# Patient Record
Sex: Male | Born: 1960 | Race: White | Hispanic: No | Marital: Single | Smoking: Former smoker
Health system: Southern US, Community
[De-identification: ages and names within clinical notes are randomized; demographics above are authoritative.]

## PROBLEM LIST (undated history)

## (undated) DIAGNOSIS — K219 Gastro-esophageal reflux disease without esophagitis: Secondary | ICD-10-CM

## (undated) DIAGNOSIS — I10 Essential (primary) hypertension: Secondary | ICD-10-CM

## (undated) DIAGNOSIS — M1990 Unspecified osteoarthritis, unspecified site: Secondary | ICD-10-CM

## (undated) HISTORY — PX: EYE SURGERY: SHX253

## (undated) HISTORY — PX: KNEE ARTHROSCOPY: SUR90

---

## 2017-07-18 DIAGNOSIS — M160 Bilateral primary osteoarthritis of hip: Secondary | ICD-10-CM | POA: Diagnosis not present

## 2017-07-18 DIAGNOSIS — M5116 Intervertebral disc disorders with radiculopathy, lumbar region: Secondary | ICD-10-CM | POA: Diagnosis not present

## 2017-07-18 DIAGNOSIS — M1612 Unilateral primary osteoarthritis, left hip: Secondary | ICD-10-CM | POA: Diagnosis not present

## 2017-07-18 DIAGNOSIS — M5136 Other intervertebral disc degeneration, lumbar region: Secondary | ICD-10-CM | POA: Diagnosis not present

## 2017-07-19 DIAGNOSIS — M1612 Unilateral primary osteoarthritis, left hip: Secondary | ICD-10-CM | POA: Diagnosis not present

## 2017-07-23 ENCOUNTER — Inpatient Hospital Stay: Payer: 59

## 2017-07-23 ENCOUNTER — Encounter
Admission: RE | Disposition: A | Discharge status: Home Health | Payer: Self-pay | Source: Ambulatory Visit | Attending: Orthopedic Surgery

## 2017-07-23 ENCOUNTER — Inpatient Hospital Stay: Payer: 59 | Admitting: Anesthesiology

## 2017-07-23 ENCOUNTER — Encounter: Payer: Self-pay | Admitting: *Deleted

## 2017-07-23 ENCOUNTER — Inpatient Hospital Stay
Admission: RE | Admit: 2017-07-23 | Discharge: 2017-07-25 | DRG: 470 | Disposition: A | Discharge status: Home Health | Payer: 59 | Source: Ambulatory Visit | Attending: Orthopedic Surgery | Admitting: Orthopedic Surgery

## 2017-07-23 DIAGNOSIS — D62 Acute posthemorrhagic anemia: Secondary | ICD-10-CM | POA: Diagnosis not present

## 2017-07-23 DIAGNOSIS — M1612 Unilateral primary osteoarthritis, left hip: Secondary | ICD-10-CM | POA: Diagnosis not present

## 2017-07-23 DIAGNOSIS — Z419 Encounter for procedure for purposes other than remedying health state, unspecified: Secondary | ICD-10-CM

## 2017-07-23 DIAGNOSIS — Z96642 Presence of left artificial hip joint: Secondary | ICD-10-CM | POA: Diagnosis not present

## 2017-07-23 DIAGNOSIS — G8918 Other acute postprocedural pain: Secondary | ICD-10-CM

## 2017-07-23 DIAGNOSIS — Z471 Aftercare following joint replacement surgery: Secondary | ICD-10-CM | POA: Diagnosis not present

## 2017-07-23 HISTORY — PX: TOTAL HIP ARTHROPLASTY: SHX124

## 2017-07-23 HISTORY — DX: Unspecified osteoarthritis, unspecified site: M19.90

## 2017-07-23 LAB — BASIC METABOLIC PANEL
Anion gap: 8 (ref 5–15)
BUN: 20 mg/dL (ref 6–20)
CHLORIDE: 107 mmol/L (ref 101–111)
CO2: 27 mmol/L (ref 22–32)
Calcium: 9.5 mg/dL (ref 8.9–10.3)
Creatinine, Ser: 0.88 mg/dL (ref 0.61–1.24)
GFR calc non Af Amer: >60 mL/min (ref >60)
Glucose, Bld: 101 mg/dL — ABNORMAL HIGH (ref 65–99)
POTASSIUM: 3.1 mmol/L — AB (ref 3.5–5.1)
SODIUM: 142 mmol/L (ref 135–145)

## 2017-07-23 LAB — URINALYSIS, ROUTINE W REFLEX MICROSCOPIC
BILIRUBIN URINE: NEGATIVE
Glucose, UA: NEGATIVE mg/dL
Hgb urine dipstick: NEGATIVE
KETONES UR: NEGATIVE mg/dL
Leukocytes, UA: NEGATIVE
NITRITE: NEGATIVE
PROTEIN: NEGATIVE mg/dL
Specific Gravity, Urine: 1.021 (ref 1.005–1.030)
pH: 5 (ref 5.0–8.0)

## 2017-07-23 LAB — CBC
HCT: 31 % — ABNORMAL LOW (ref 40.0–52.0)
HEMOGLOBIN: 9.8 g/dL — AB (ref 13.0–18.0)
MCH: 22.6 pg — ABNORMAL LOW (ref 26.0–34.0)
MCHC: 31.5 g/dL — ABNORMAL LOW (ref 32.0–36.0)
MCV: 71.7 fL — ABNORMAL LOW (ref 80.0–100.0)
Platelets: 456 10*3/uL — ABNORMAL HIGH (ref 150–440)
RBC: 4.33 MIL/uL — ABNORMAL LOW (ref 4.40–5.90)
RDW: 18 % — ABNORMAL HIGH (ref 11.5–14.5)
WBC: 6 10*3/uL (ref 3.8–10.6)

## 2017-07-23 LAB — TYPE AND SCREEN
ABO/RH(D): A POS
Antibody Screen: NEGATIVE

## 2017-07-23 LAB — APTT: APTT: 26 s (ref 24–36)

## 2017-07-23 LAB — SURGICAL PCR SCREEN
MRSA, PCR: NEGATIVE
STAPHYLOCOCCUS AUREUS: NEGATIVE

## 2017-07-23 LAB — SEDIMENTATION RATE: Sed Rate: 45 mm/hr — ABNORMAL HIGH (ref 0–20)

## 2017-07-23 LAB — ABO/RH: ABO/RH(D): A POS

## 2017-07-23 LAB — PROTIME-INR
INR: 1.01
PROTHROMBIN TIME: 13.3 s (ref 11.4–15.2)

## 2017-07-23 SURGERY — ARTHROPLASTY, HIP, TOTAL, ANTERIOR APPROACH
Anesthesia: General | Site: Hip | Laterality: Left | Wound class: Clean

## 2017-07-23 MED ORDER — ACETAMINOPHEN 325 MG PO TABS: 650.0000 mg | ORAL_TABLET | Freq: Four times a day (QID) | ORAL | Status: DC | PRN

## 2017-07-23 MED ORDER — CEFAZOLIN SODIUM-DEXTROSE 2-4 GM/100ML-% IV SOLN
2.0000 g | Freq: Once | INTRAVENOUS | Status: AC
  Administered 2017-07-23: 2 g via INTRAVENOUS

## 2017-07-23 MED ORDER — PROPOFOL 500 MG/50ML IV EMUL
INTRAVENOUS | Status: DC | PRN
  Administered 2017-07-23: 140 ug/kg/min via INTRAVENOUS

## 2017-07-23 MED ORDER — PHENYLEPHRINE HCL 10 MG/ML IJ SOLN
INTRAMUSCULAR | Status: DC | PRN
  Administered 2017-07-23: 25 ug via INTRAVENOUS

## 2017-07-23 MED ORDER — METHOCARBAMOL 1000 MG/10ML IJ SOLN
500.0000 mg | Freq: Four times a day (QID) | INTRAVENOUS | Status: DC | PRN
  Filled 2017-07-23: qty 5

## 2017-07-23 MED ORDER — BUPIVACAINE HCL (PF) 0.5 % IJ SOLN
INTRAMUSCULAR | Status: DC | PRN
  Administered 2017-07-23: 3 mL

## 2017-07-23 MED ORDER — DIPHENHYDRAMINE HCL 12.5 MG/5ML PO ELIX: 12.5000 mg | ORAL_SOLUTION | ORAL | Status: DC | PRN

## 2017-07-23 MED ORDER — DOCUSATE SODIUM 100 MG PO CAPS
100.0000 mg | ORAL_CAPSULE | Freq: Two times a day (BID) | ORAL | Status: DC
  Administered 2017-07-23 – 2017-07-25 (×4): 100 mg via ORAL
  Filled 2017-07-23 (×4): qty 1

## 2017-07-23 MED ORDER — MIDAZOLAM HCL 2 MG/2ML IJ SOLN
INTRAMUSCULAR | Status: AC
  Filled 2017-07-23: qty 2

## 2017-07-23 MED ORDER — SODIUM CHLORIDE 0.9 % IV SOLN
INTRAVENOUS | Status: DC | PRN
  Administered 2017-07-23: 1000 mg via INTRAVENOUS

## 2017-07-23 MED ORDER — LORAZEPAM 1 MG PO TABS: 1.0000 mg | ORAL_TABLET | ORAL | Status: DC | PRN

## 2017-07-23 MED ORDER — NEOMYCIN-POLYMYXIN B GU 40-200000 IR SOLN
Status: AC
  Filled 2017-07-23: qty 4

## 2017-07-23 MED ORDER — BUPIVACAINE-EPINEPHRINE (PF) 0.25% -1:200000 IJ SOLN
INTRAMUSCULAR | Status: AC
  Filled 2017-07-23: qty 30

## 2017-07-23 MED ORDER — MENTHOL 3 MG MT LOZG
1.0000 | LOZENGE | OROMUCOSAL | Status: DC | PRN
  Filled 2017-07-23: qty 9

## 2017-07-23 MED ORDER — PROPOFOL 10 MG/ML IV BOLUS
INTRAVENOUS | Status: DC | PRN
  Administered 2017-07-23: 100 mg via INTRAVENOUS

## 2017-07-23 MED ORDER — TRANEXAMIC ACID 1000 MG/10ML IV SOLN
INTRAVENOUS | Status: AC
  Filled 2017-07-23: qty 10

## 2017-07-23 MED ORDER — ZOLPIDEM TARTRATE 5 MG PO TABS: 5.0000 mg | ORAL_TABLET | Freq: Every evening | ORAL | Status: DC | PRN

## 2017-07-23 MED ORDER — OXYCODONE HCL 5 MG PO TABS
5.0000 mg | ORAL_TABLET | ORAL | Status: DC | PRN
  Administered 2017-07-23 (×2): 5 mg via ORAL
  Administered 2017-07-24 – 2017-07-25 (×8): 10 mg via ORAL
  Filled 2017-07-23 (×2): qty 2
  Filled 2017-07-23: qty 1
  Filled 2017-07-23 (×3): qty 2
  Filled 2017-07-23: qty 1
  Filled 2017-07-23 (×3): qty 2

## 2017-07-23 MED ORDER — PROPOFOL 500 MG/50ML IV EMUL
INTRAVENOUS | Status: AC
Start: 2017-07-23 — End: 2017-07-23
  Filled 2017-07-23: qty 50

## 2017-07-23 MED ORDER — LACTATED RINGERS IV SOLN
INTRAVENOUS | Status: DC
  Administered 2017-07-23 (×2): via INTRAVENOUS

## 2017-07-23 MED ORDER — BISACODYL 10 MG RE SUPP: 10.0000 mg | Freq: Every day | RECTAL | Status: DC | PRN

## 2017-07-23 MED ORDER — FAMOTIDINE 20 MG PO TABS
20.0000 mg | ORAL_TABLET | Freq: Once | ORAL | Status: AC
  Administered 2017-07-23: 20 mg via ORAL

## 2017-07-23 MED ORDER — ACETAMINOPHEN 650 MG RE SUPP: 650.0000 mg | Freq: Four times a day (QID) | RECTAL | Status: DC | PRN

## 2017-07-23 MED ORDER — ENOXAPARIN SODIUM 40 MG/0.4ML ~~LOC~~ SOLN
40.0000 mg | SUBCUTANEOUS | Status: DC
  Administered 2017-07-24 – 2017-07-25 (×2): 40 mg via SUBCUTANEOUS
  Filled 2017-07-23 (×2): qty 0.4

## 2017-07-23 MED ORDER — MIDAZOLAM HCL 5 MG/5ML IJ SOLN
INTRAMUSCULAR | Status: DC | PRN
  Administered 2017-07-23 (×2): 2 mg via INTRAVENOUS

## 2017-07-23 MED ORDER — TRANEXAMIC ACID 1000 MG/10ML IV SOLN
1000.0000 mg | INTRAVENOUS | Status: DC
  Filled 2017-07-23: qty 10

## 2017-07-23 MED ORDER — CEFAZOLIN SODIUM-DEXTROSE 2-4 GM/100ML-% IV SOLN
2.0000 g | Freq: Four times a day (QID) | INTRAVENOUS | Status: AC
  Administered 2017-07-23 – 2017-07-24 (×3): 2 g via INTRAVENOUS
  Filled 2017-07-23 (×3): qty 100

## 2017-07-23 MED ORDER — BUPIVACAINE-EPINEPHRINE 0.25% -1:200000 IJ SOLN
INTRAMUSCULAR | Status: DC | PRN
  Administered 2017-07-23: 30 mL

## 2017-07-23 MED ORDER — ADULT MULTIVITAMIN W/MINERALS CH
1.0000 | ORAL_TABLET | Freq: Every day | ORAL | Status: DC
  Administered 2017-07-23 – 2017-07-25 (×3): 1 via ORAL
  Filled 2017-07-23 (×3): qty 1

## 2017-07-23 MED ORDER — METOCLOPRAMIDE HCL 10 MG PO TABS: 5.0000 mg | ORAL_TABLET | Freq: Three times a day (TID) | ORAL | Status: DC | PRN

## 2017-07-23 MED ORDER — FENTANYL CITRATE (PF) 100 MCG/2ML IJ SOLN: 25.0000 ug | INTRAMUSCULAR | Status: DC | PRN

## 2017-07-23 MED ORDER — PHENOL 1.4 % MT LIQD
1.0000 | OROMUCOSAL | Status: DC | PRN
  Filled 2017-07-23: qty 177

## 2017-07-23 MED ORDER — METHOCARBAMOL 500 MG PO TABS
500.0000 mg | ORAL_TABLET | Freq: Four times a day (QID) | ORAL | Status: DC | PRN
  Administered 2017-07-23 – 2017-07-25 (×5): 500 mg via ORAL
  Filled 2017-07-23 (×5): qty 1

## 2017-07-23 MED ORDER — METOCLOPRAMIDE HCL 5 MG/ML IJ SOLN: 5.0000 mg | Freq: Three times a day (TID) | INTRAMUSCULAR | Status: DC | PRN

## 2017-07-23 MED ORDER — NEOMYCIN-POLYMYXIN B GU 40-200000 IR SOLN
Status: DC | PRN
  Administered 2017-07-23: 4 mL

## 2017-07-23 MED ORDER — GLYCOPYRROLATE 0.2 MG/ML IJ SOLN
INTRAMUSCULAR | Status: DC | PRN
  Administered 2017-07-23: 0.2 mg via INTRAVENOUS

## 2017-07-23 MED ORDER — FAMOTIDINE 20 MG PO TABS
ORAL_TABLET | ORAL | Status: AC
  Administered 2017-07-23: 20 mg via ORAL
  Filled 2017-07-23: qty 1

## 2017-07-23 MED ORDER — PROPOFOL 500 MG/50ML IV EMUL
INTRAVENOUS | Status: AC
  Filled 2017-07-23: qty 50

## 2017-07-23 MED ORDER — ALUM & MAG HYDROXIDE-SIMETH 200-200-20 MG/5ML PO SUSP: 30.0000 mL | ORAL | Status: DC | PRN

## 2017-07-23 MED ORDER — MORPHINE SULFATE (PF) 2 MG/ML IV SOLN
2.0000 mg | INTRAVENOUS | Status: DC | PRN
  Administered 2017-07-23 – 2017-07-24 (×2): 2 mg via INTRAVENOUS
  Filled 2017-07-23 (×2): qty 1

## 2017-07-23 MED ORDER — ONDANSETRON HCL 4 MG/2ML IJ SOLN: 4.0000 mg | Freq: Once | INTRAMUSCULAR | Status: DC | PRN

## 2017-07-23 MED ORDER — PROPOFOL 10 MG/ML IV BOLUS
INTRAVENOUS | Status: AC
  Filled 2017-07-23: qty 60

## 2017-07-23 MED ORDER — CEFAZOLIN SODIUM-DEXTROSE 2-4 GM/100ML-% IV SOLN
INTRAVENOUS | Status: AC
  Filled 2017-07-23: qty 100

## 2017-07-23 MED ORDER — MAGNESIUM HYDROXIDE 400 MG/5ML PO SUSP
30.0000 mL | Freq: Every day | ORAL | Status: DC | PRN
  Administered 2017-07-24 – 2017-07-25 (×2): 30 mL via ORAL
  Filled 2017-07-23 (×2): qty 30

## 2017-07-23 MED ORDER — GLYCOPYRROLATE 0.2 MG/ML IJ SOLN
INTRAMUSCULAR | Status: AC
  Filled 2017-07-23: qty 1

## 2017-07-23 MED ORDER — ONDANSETRON HCL 4 MG PO TABS: 4.0000 mg | ORAL_TABLET | Freq: Four times a day (QID) | ORAL | Status: DC | PRN

## 2017-07-23 MED ORDER — MAGNESIUM CITRATE PO SOLN
1.0000 | Freq: Once | ORAL | Status: DC | PRN
  Filled 2017-07-23: qty 296

## 2017-07-23 MED ORDER — ONDANSETRON HCL 4 MG/2ML IJ SOLN: 4.0000 mg | Freq: Four times a day (QID) | INTRAMUSCULAR | Status: DC | PRN

## 2017-07-23 SURGICAL SUPPLY — 51 items
BLADE SAW SAG 18.5X105 (BLADE) ×2 IMPLANT
BNDG COHESIVE 6X5 TAN STRL LF (GAUZE/BANDAGES/DRESSINGS) ×6 IMPLANT
CANISTER SUCT 1200ML W/VALVE (MISCELLANEOUS) ×2 IMPLANT
CAPT HIP TOTAL HALF DEPUY (Capitated) ×2 IMPLANT
CAPT HIP TOTAL HALF MEDACTA (Capitated) ×2 IMPLANT
CATH FOL LEG HOLDER (MISCELLANEOUS) ×2 IMPLANT
CATH TRAY METER 16FR LF (MISCELLANEOUS) ×2 IMPLANT
CHLORAPREP W/TINT 26ML (MISCELLANEOUS) ×2 IMPLANT
DRAPE C-ARM XRAY 36X54 (DRAPES) ×2 IMPLANT
DRAPE INCISE IOBAN 66X60 STRL (DRAPES) IMPLANT
DRAPE POUCH INSTRU U-SHP 10X18 (DRAPES) ×2 IMPLANT
DRAPE SHEET LG 3/4 BI-LAMINATE (DRAPES) ×6 IMPLANT
DRAPE TABLE BACK 80X90 (DRAPES) ×2 IMPLANT
DRESSING SURGICEL FIBRLLR 1X2 (HEMOSTASIS) ×2 IMPLANT
DRSG OPSITE POSTOP 4X8 (GAUZE/BANDAGES/DRESSINGS) ×2 IMPLANT
DRSG SURGICEL FIBRILLAR 1X2 (HEMOSTASIS) ×4
ELECT BLADE 6.5 EXT (BLADE) ×2 IMPLANT
ELECT REM PT RETURN 9FT ADLT (ELECTROSURGICAL) ×2
ELECTRODE REM PT RTRN 9FT ADLT (ELECTROSURGICAL) ×1 IMPLANT
EVACUATOR 1/8 PVC DRAIN (DRAIN) IMPLANT
GLOVE BIOGEL PI IND STRL 9 (GLOVE) ×3 IMPLANT
GLOVE BIOGEL PI INDICATOR 9 (GLOVE) ×3
GLOVE SURG SYN 9.0  PF PI (GLOVE) ×3
GLOVE SURG SYN 9.0 PF PI (GLOVE) ×3 IMPLANT
GOWN SRG 2XL LVL 4 RGLN SLV (GOWNS) ×1 IMPLANT
GOWN STRL NON-REIN 2XL LVL4 (GOWNS) ×1
GOWN STRL REUS W/ TWL LRG LVL3 (GOWN DISPOSABLE) ×1 IMPLANT
GOWN STRL REUS W/TWL LRG LVL3 (GOWN DISPOSABLE) ×1
HOOD PEEL AWAY FLYTE STAYCOOL (MISCELLANEOUS) ×2 IMPLANT
KIT PREVENA INCISION MGT 13 (CANNISTER) IMPLANT
MAT BLUE FLOOR 46X72 FLO (MISCELLANEOUS) ×2 IMPLANT
NDL SAFETY 18GX1.5 (NEEDLE) ×2 IMPLANT
NEEDLE SPNL 18GX3.5 QUINCKE PK (NEEDLE) ×2 IMPLANT
NS IRRIG 1000ML POUR BTL (IV SOLUTION) ×2 IMPLANT
PACK HIP COMPR (MISCELLANEOUS) ×2 IMPLANT
SOL PREP PVP 2OZ (MISCELLANEOUS)
SOLUTION PREP PVP 2OZ (MISCELLANEOUS) IMPLANT
SPONGE DRAIN TRACH 4X4 STRL 2S (GAUZE/BANDAGES/DRESSINGS) IMPLANT
STAPLER SKIN PROX 35W (STAPLE) ×2 IMPLANT
STRAP SAFETY BODY (MISCELLANEOUS) ×2 IMPLANT
SUT DVC 2 QUILL PDO  T11 36X36 (SUTURE) ×1
SUT DVC 2 QUILL PDO T11 36X36 (SUTURE) ×1 IMPLANT
SUT SILK 0 (SUTURE) ×1
SUT SILK 0 30XBRD TIE 6 (SUTURE) ×1 IMPLANT
SUT V-LOC 90 ABS DVC 3-0 CL (SUTURE) ×2 IMPLANT
SUT VIC AB 1 CT1 36 (SUTURE) ×2 IMPLANT
SYR 20CC LL (SYRINGE) ×2 IMPLANT
SYR 30ML LL (SYRINGE) ×2 IMPLANT
TAPE MICROFOAM 4IN (TAPE) IMPLANT
TOWEL OR 17X26 4PK STRL BLUE (TOWEL DISPOSABLE) IMPLANT
WND VAC CANISTER 500ML (MISCELLANEOUS) IMPLANT

## 2017-07-23 NOTE — Anesthesia Post-op Follow-up Note (Signed)
Anesthesia QCDR form completed.        

## 2017-07-23 NOTE — H&P (Signed)
Reviewed paper H+P, will be scanned into chart. No changes noted.  

## 2017-07-23 NOTE — Anesthesia Procedure Notes (Signed)
Spinal  Patient location during procedure: OR Start time: 07/23/2017 3:09 PM End time: 07/23/2017 3:10 PM Staffing Anesthesiologist: Andria Frames Resident/CRNA: Nelda Marseille Performed: resident/CRNA  Preanesthetic Checklist Completed: patient identified, site marked, surgical consent, pre-op evaluation, timeout performed, IV checked, risks and benefits discussed and monitors and equipment checked Spinal Block Patient position: sitting Prep: ChloraPrep Patient monitoring: heart rate, continuous pulse ox, blood pressure and cardiac monitor Approach: midline Location: L3-4 Injection technique: single-shot Needle Needle type: Whitacre and Introducer  Needle gauge: 24 G Needle length: 9 cm Assessment Sensory level: T10 Additional Notes Negative paresthesia. Negative blood return. Positive free-flowing CSF. Expiration date of kit checked and confirmed. Patient tolerated procedure well, without complications.

## 2017-07-23 NOTE — Transfer of Care (Signed)
Immediate Anesthesia Transfer of Care Note  Patient: Arthur Willis  Procedure(s) Performed: Procedure(s): TOTAL HIP ARTHROPLASTY ANTERIOR APPROACH (Left)  Patient Location: PACU  Anesthesia Type:Spinal  Level of Consciousness: sedated  Airway & Oxygen Therapy: Patient Spontanous Breathing and Patient connected to face mask oxygen  Post-op Assessment: Report given to RN and Post -op Vital signs reviewed and stable  Post vital signs: Reviewed and stable  Last Vitals:  Vitals:   07/23/17 1343  BP: (!) 177/94  Pulse: (!) 103  Resp: 20  Temp: 36.7 C  SpO2: 100%    Last Pain:  Vitals:   07/23/17 1343  TempSrc: Oral  PainSc: 4          Complications: No apparent anesthesia complications

## 2017-07-23 NOTE — Op Note (Signed)
07/23/2017  5:19 PM  PATIENT:  Arthur Willis  56 y.o. male  PRE-OPERATIVE DIAGNOSIS:  PRIMARY LOCALIZED OSTEOARTHRITIS OF LEFT HIP  POST-OPERATIVE DIAGNOSIS:  PRIMARY LOCALIZED OSTEOARTHRITIS OF LEFT HIP  PROCEDURE:  Procedure(s): TOTAL HIP ARTHROPLASTY ANTERIOR APPROACH (Left)  SURGEON: Leitha Schuller, MD  ASSISTANTS: None  ANESTHESIA:   spinal  EBL:  Total I/O In: 1100 [I.V.:1100] Out: 700 [Urine:200; Blood:500]  BLOOD ADMINISTERED:none  DRAINS: none   LOCAL MEDICATIONS USED:  MARCAINE     SPECIMEN:  Source of Specimen:  Left femoral head  DISPOSITION OF SPECIMEN:  PATHOLOGY  COUNTS:  YES  TOURNIQUET:  * No tourniquets in log *  IMPLANTS: Medacta 58 Mpact DM cup with liner, Depuy 6 Actis stem with +8.5 ceramic head  DICTATION: .Dragon Dictation The patient was brought to the operating room and after spinal anesthesia was obtained patientwas placed on the operative table with the ipsilateral foot into the Medacta attachment, contralateral leg on a well-padded table. C-arm was brought in and preop template x-ray taken. After prepping and draping in usual sterile fashion appropriate patient identification and timeout procedures were completed. Anterior approach to the hip was obtained and centered over the greater trochanter and TFL muscle. The subcutaneous tissue was incised hemostasis being achieved by electrocautery. TFL fascia was incised and the muscle retracted laterally deep retractor placed. The lateral femoral circumflex vessels were identified and ligated. The anterior capsule was exposed and a capsulotomy performed. The neck was identified and a femoral neck cut carried out with a saw. The head was removed without difficulty and showed sclerotic femoral head and acetabulum. Reaming was carried out to 56 mm and a 58 mm cup trial gave appropriate tightness to the acetabular component a 58 DMcup was impacted into position. The leg was then externally rotated and  ischiofemoral and pubofemoral releases carried out. The femur was sequentially broached to a size 6, size 6 stem with standard neck and +8.5 mm head with bipolar linertrials were placed and the final components chosen. The 6 standardstem was inserted along with a +8.528 mm ceramic head and 10mm liner. The hip was reduced and was stable the wound was thoroughly irrigated , fibrillar was then placed along the posterior capsular incision from the release and along the medial neck obtained postop hemostasisThe deep fascia was closed usinga heavy Quill after infiltration of 30 cc of quarter percent Sensorcaine with epinephrine. 3-0 v-loc subcuticular closure followed by skin staples and Xeroform with honeycomb dressing applied  PLAN OF CARE: Admit to inpatient

## 2017-07-23 NOTE — Anesthesia Preprocedure Evaluation (Signed)
Anesthesia Evaluation  Patient identified by MRN, date of birth, ID band Patient awake    Reviewed: Allergy & Precautions, H&P , NPO status , Patient's Chart, lab work & pertinent test results, reviewed documented beta blocker date and time   Airway Mallampati: II   Neck ROM: full    Dental  (+) Poor Dentition   Pulmonary neg pulmonary ROS,    Pulmonary exam normal        Cardiovascular negative cardio ROS Normal cardiovascular exam Rhythm:regular Rate:Normal     Neuro/Psych negative neurological ROS  negative psych ROS   GI/Hepatic negative GI ROS, Neg liver ROS,   Endo/Other  negative endocrine ROS  Renal/GU negative Renal ROS  negative genitourinary   Musculoskeletal   Abdominal   Peds  Hematology negative hematology ROS (+)   Anesthesia Other Findings No past medical history on file. No past surgical history on file.   Reproductive/Obstetrics negative OB ROS                             Anesthesia Physical Anesthesia Plan  ASA: II  Anesthesia Plan: General and Spinal   Post-op Pain Management:    Induction:   PONV Risk Score and Plan: 3 and Ondansetron, Dexamethasone, Midazolam and Propofol infusion  Airway Management Planned:   Additional Equipment:   Intra-op Plan:   Post-operative Plan:   Informed Consent: I have reviewed the patients History and Physical, chart, labs and discussed the procedure including the risks, benefits and alternatives for the proposed anesthesia with the patient or authorized representative who has indicated his/her understanding and acceptance.   Dental Advisory Given  Plan Discussed with: CRNA  Anesthesia Plan Comments:         Anesthesia Quick Evaluation

## 2017-07-23 NOTE — H&P (Deleted)
07/23/2017  2:33 PM  PATIENT:  Arthur Willis  56 y.o. male  PRE-OPERATIVE DIAGNOSIS:  PRIMARY LOCALIZED OSTEOARTHRITIS OF LEFT HIP  POST-OPERATIVE DIAGNOSIS:  PRIMARY LOCALIZED OSTEOARTHRITIS OF LEFT HIP  PROCEDURE:  Procedure(s): TOTAL HIP ARTHROPLASTY ANTERIOR APPROACH (Left)  SURGEON: Leitha Schuller, MD  ASSISTANTS: None  ANESTHESIA:   spinal  EBL:  No intake/output data recorded.  BLOOD ADMINISTERED:none  DRAINS: none   LOCAL MEDICATIONS USED:  MARCAINE     SPECIMEN:  Source of Specimen:  Right femoral head  DISPOSITION OF SPECIMEN:  PATHOLOGY  COUNTS:  YES  TOURNIQUET:  * No tourniquets in log *  IMPLANTS: Medacta Mpact DM cup and liner 54 mm, Depuy Actis size 8 stem with 28 mm +1.5 mm femoral head, metal  DICTATION: .Dragon Dictation   The patient was brought to the operating room and after spinal anesthesia was obtained patient was placed on the operative table with the ipsilateral foot into the Medacta attachment, contralateral leg on a well-padded table. C-arm was brought in and preop template x-ray taken. After prepping and draping in usual sterile fashion appropriate patient identification and timeout procedures were completed. Anterior approach to the hip was obtained and centered over the greater trochanter and TFL muscle. The subcutaneous tissue was incised hemostasis being achieved by electrocautery. TFL fascia was incised and the muscle retracted laterally deep retractor placed. The lateral femoral circumflex vessels were identified and ligated. The anterior capsule was exposed and a capsulotomy performed. The neck was identified and a femoral neck cut carried out with a saw. The head was removed without difficulty and showed sclerotic femoral head and acetabulum. Reaming was carried out to 54 mm and a 54 mm cup trial gave appropriate tightness to the acetabular component a 54 DM cup was impacted into position. The leg was then externally rotated and ischiofemoral  and pubofemoral releases carried out. The femur was sequentially broached to a size 8, size 8 stem with standard neck and +1.5 mm head with bipolar liner trials were placed and the final components chosen. The 8 standard stem was inserted along with a +1.5 28 mm head and 54 mm liner. The hip was reduced and was stable the wound was thoroughly irrigated , fibrillar was then placed along the posterior capsular incision from the release and along the medial neck obtained postop hemostasis The deep fascia was closed using a heavy Quill after infiltration of 30 cc of quarter percent Sensorcaine with epinephrine. 3-0 v-loc subcuticular closure followed by skin staples and Xeroform with honeycomb dressing applied  PLAN OF CARE: Admit to inpatient

## 2017-07-24 ENCOUNTER — Encounter: Payer: Self-pay | Admitting: Orthopedic Surgery

## 2017-07-24 LAB — BASIC METABOLIC PANEL
Anion gap: 7 (ref 5–15)
BUN: 19 mg/dL (ref 6–20)
CALCIUM: 8.2 mg/dL — AB (ref 8.9–10.3)
CHLORIDE: 102 mmol/L (ref 101–111)
CO2: 27 mmol/L (ref 22–32)
Creatinine, Ser: 0.92 mg/dL (ref 0.61–1.24)
Glucose, Bld: 128 mg/dL — ABNORMAL HIGH (ref 65–99)
Potassium: 3.6 mmol/L (ref 3.5–5.1)
SODIUM: 136 mmol/L (ref 135–145)

## 2017-07-24 LAB — CBC
HEMATOCRIT: 25.1 % — AB (ref 40.0–52.0)
HEMOGLOBIN: 8 g/dL — AB (ref 13.0–18.0)
MCH: 22.9 pg — ABNORMAL LOW (ref 26.0–34.0)
MCHC: 31.8 g/dL — AB (ref 32.0–36.0)
MCV: 71.8 fL — ABNORMAL LOW (ref 80.0–100.0)
PLATELETS: 386 10*3/uL (ref 150–440)
RBC: 3.5 MIL/uL — AB (ref 4.40–5.90)
RDW: 17.3 % — AB (ref 11.5–14.5)
WBC: 10.6 10*3/uL (ref 3.8–10.6)

## 2017-07-24 MED ORDER — FE FUMARATE-B12-VIT C-FA-IFC PO CAPS
1.0000 | ORAL_CAPSULE | Freq: Three times a day (TID) | ORAL | Status: DC
  Administered 2017-07-24 – 2017-07-25 (×4): 1 via ORAL
  Filled 2017-07-24 (×5): qty 1

## 2017-07-24 MED ORDER — FE FUMARATE-B12-VIT C-FA-IFC PO CAPS: 1.0000 | ORAL_CAPSULE | Freq: Two times a day (BID) | ORAL | Status: DC

## 2017-07-24 NOTE — Progress Notes (Signed)
Clinical Social Worker (CSW) received SNF consult. PT is recommending outpatient PT. RN case manager aware of above. Please reconsult if future social work needs arise. CSW signing off.   Jasmyn Picha, LCSW [phone]  

## 2017-07-24 NOTE — Progress Notes (Signed)
Physical Therapy Treatment Patient Details Name: Arthur Willis MRN: [id number] DOB: 10/10/1961 Today's Date: 07/24/2017    History of Present Illness admitted for acute hospitalization status post L THR (07/23/17), anterior approach, WBAT.    PT Comments    Pt agreeable to PT; reports 5/10 pain in left hip. Pt demonstrating Mod I with transfers and supervision/Mod I with gait. Pt notes initially hesitant to place weight through Left lower extremity, but gaining confidence. Pt demonstrates minimal lean on rolling walker and fluid, reciprocal gait pattern. Only requires instruction to hold rolling walker a little further in front of body with good correction. Pt instructed on steps this session with demonstration of understanding well. Pt educated on anterior hip precautions and practical application once home. Continue Pt to progress strength and endurance to improve all functional mobility toward independence and allow for an optimal, safe return home.    Follow Up Recommendations  Outpatient PT     Equipment Recommendations  Rolling walker with 5" wheels    Recommendations for Other Services       Precautions / Restrictions Precautions Precautions: Fall;Anterior Hip Precaution Comments: Reviewed ant'r hip precaution and practical application at home Restrictions Weight Bearing Restrictions: Yes LLE Weight Bearing: Weight bearing as tolerated    Mobility  Bed Mobility Overal bed mobility: Modified Independent             General bed mobility comments: not tested; up in chair  Transfers Overall transfer level: Modified independent Equipment used: Rolling walker (2 wheeled) Transfers: Sit to/from Stand Sit to Stand: Modified independent (Device/Increase time)         General transfer comment: Good use of hands and BLEs  Ambulation/Gait Ambulation/Gait assistance: Supervision Ambulation Distance (Feet): 270 Feet Assistive device: Rolling walker (2 wheeled) Gait  Pattern/deviations: Step-through pattern Gait velocity: much improved (10' walk time, 6 seconds) Gait velocity interpretation: Below normal speed for age/gender General Gait Details: minimal use of rw; fluid pace/cadence. Verbal cueing to avoid walking too close inside rw with good correction.   Stairs Stairs: Yes   Stair Management: One rail Right;Forwards Number of Stairs: 6 General stair comments: instruction with good follow through/demonstration  Wheelchair Mobility    Modified Rankin (Stroke Patients Only)       Balance Overall balance assessment: Needs assistance Sitting-balance support: No upper extremity supported;Feet supported Sitting balance-Leahy Scale: Normal     Standing balance support: Bilateral upper extremity supported (Fair +) Standing balance-Leahy Scale: Fair                              Cognition Arousal/Alertness: Awake/alert Behavior During Therapy: WFL for tasks assessed/performed Overall Cognitive Status: Within Functional Limits for tasks assessed                                        Exercises Other Exercises Other Exercises: Supine LE therex, 1x10, AROM for muscular strength/endurnace.  Good L LE Lstrength/control noted with all isolated therex. Other Exercises: Verbally reviewed anterior THPs and WBing precautions; will continue to reinforce throughout stay.    General Comments        Pertinent Vitals/Pain Pain Assessment: 0-10 Pain Score: 5  Pain Location: L hip Pain Descriptors / Indicators: Aching;Operative site guarding Pain Intervention(s): Monitored during session;Premedicated before session    Home Living Family/patient expects to be discharged to:: Private residence Living  Arrangements: Alone Available Help at Discharge: Family Type of Home: House Home Access: Stairs to enter Entrance Stairs-Rails: None Home Layout: One level Home Equipment: None      Prior Function Level of  Independence: Independent      Comments: Indep with ADLs, household and community mobility; working full-time as Psychologist, occupational   PT Goals (current goals can now be found in the care plan section) Acute Rehab PT Goals Patient Stated Goal: to return home PT Goal Formulation: With patient/family Time For Goal Achievement: 08/07/17 Potential to Achieve Goals: Good Progress towards PT goals: Progressing toward goals    Frequency    BID      PT Plan Current plan remains appropriate    Co-evaluation              AM-PAC PT "6 Clicks" Daily Activity  Outcome Measure  Difficulty turning over in bed (including adjusting bedclothes, sheets and blankets)?: None Difficulty moving from lying on back to sitting on the side of the bed? : None Difficulty sitting down on and standing up from a chair with arms (e.g., wheelchair, bedside commode, etc,.)?: None Help needed moving to and from a bed to chair (including a wheelchair)?: None Help needed walking in hospital room?: None Help needed climbing 3-5 steps with a railing? : A Little 6 Click Score: 23    End of Session Equipment Utilized During Treatment: Gait belt Activity Tolerance: Patient tolerated treatment well Patient left: in chair;with call bell/phone within reach;with family/visitor present;Other (comment) (refused alarm; will call for assist) Nurse Communication: Mobility status PT Visit Diagnosis: Muscle weakness (generalized) (M62.81);Difficulty in walking, not elsewhere classified (R26.2);Pain Pain - Right/Left: Left Pain - part of body: Hip     Time: 1228-1247 PT Time Calculation (min) (ACUTE ONLY): 19 min  Charges:  $Gait Training: 8-22 mins $Therapeutic Exercise: 8-22 mins                    G Codes:  Functional Assessment Tool Used: AM-PAC 6 Clicks Basic Mobility Functional Limitation: Mobility: Walking and moving around Mobility: Walking and Moving Around Current Status (Z6109): At least 20 percent but less than  40 percent impaired, limited or restricted Mobility: Walking and Moving Around Goal Status [phone]): At least 1 percent but less than 20 percent impaired, limited or restricted     Scot Dock, PTA 07/24/2017, 12:57 PM

## 2017-07-24 NOTE — Progress Notes (Signed)
   Subjective: 1 Day Post-Op Procedure(s) (LRB): TOTAL HIP ARTHROPLASTY ANTERIOR APPROACH (Left) Patient reports pain as mild.   Patient is well, and has had no acute complaints or problems Denies any CP, SOB, ABD pain. We will continue therapy today.  Plan is to go Home after hospital stay.  Objective: Vital signs in last 24 hours: Temp:  [97.1 F (36.2 C)-98.4 F (36.9 C)] 98.1 F (36.7 C) (08/22 0730) Pulse Rate:  [71-103] 86 (08/22 0730) Resp:  [10-20] 16 (08/22 0730) BP: (89-177)/(52-94) 153/90 (08/22 0730) SpO2:  [98 %-100 %] 100 % (08/22 0730) Weight:  [98.4 kg (217 lb)] 98.4 kg (217 lb) (08/21 1343)  Intake/Output from previous day: 08/21 0701 - 08/22 0700 In: 2000 [I.V.:1800; IV Piggyback:200] Out: 1480 [Urine:980; Blood:500] Intake/Output this shift: No intake/output data recorded.   Recent Labs  07/23/17 1251 07/24/17 0423  HGB 9.8* 8.0*    Recent Labs  07/23/17 1251 07/24/17 0423  WBC 6.0 10.6  RBC 4.33* 3.50*  HCT 31.0* 25.1*  PLT 456* 386    Recent Labs  07/23/17 1251 07/24/17 0423  NA 142 136  K 3.1* 3.6  CL 107 102  CO2 27 27  BUN 20 19  CREATININE 0.88 0.92  GLUCOSE 101* 128*  CALCIUM 9.5 8.2*    Recent Labs  07/23/17 1251  INR 1.01    EXAM General - Patient is Alert, Appropriate and Oriented Extremity - Neurovascular intact Sensation intact distally Intact pulses distally Dorsiflexion/Plantar flexion intact No cellulitis present Compartment soft Dressing - dressing C/D/I and no drainage Motor Function - intact, moving foot and toes well on exam.   Past Medical History:  Diagnosis Date  . Arthritis     Assessment/Plan:   1 Day Post-Op Procedure(s) (LRB): TOTAL HIP ARTHROPLASTY ANTERIOR APPROACH (Left) Active Problems:   Primary osteoarthritis of left hip  Estimated body mass index is 29.43 kg/m as calculated from the following:   Height as of this encounter: 6' (1.829 m).   Weight as of this encounter: 98.4  kg (217 lb). Advance diet Up with therapy   Needs BM  Acute post op blood loss anemia with underlying chronic anemia- continue with Iron. Patient asymptomatic.  Recheck labs in the am  CM to assist with discharge  DVT Prophylaxis - Lovenox, Foot Pumps and TED hose Weight-Bearing as tolerated to left leg   T. Cranston Neighbor, PA-C [facility] 07/24/2017, 8:07 AM

## 2017-07-24 NOTE — Evaluation (Signed)
Physical Therapy Evaluation Patient Details Name: Arthur Willis MRN: [id number] DOB: [birth year] Today's Date: 07/24/2017   History of Present Illness  admitted for acute hospitalization status post L THR (07/23/17), anterior approach, WBAT.  Clinical Impression  Upon evaluation, patient alert and oriented; follows all commands and demonstrates excellent effort/participation with all therapeutic activities.  L LE hip strength grossly 3-/5, guarded due to post-op pain; however, good muscle activation and control noted with all therex.  Demonstrates ability to complete bed mobility with mod indep; sit/stand, basic transfers and gait (130') with RW, cga.  Step to progressing to step through gait pattern with progressive increase in L LE stance time/weight acceptance noted.  Excellent efforts to integrate cuing during gait trial as appropriate.  Slow and guarded with initial gait trial (10' walk time, 12 seconds); anticipate rapid progression as comfort/confidence in surgical extremity improves. Would benefit from skilled PT to address above deficits and promote optimal return to PLOF; recommend transition to home with outpatient PT services as appropriate.    Follow Up Recommendations Outpatient PT    Equipment Recommendations  Rolling walker with 5" wheels    Recommendations for Other Services       Precautions / Restrictions Precautions Precautions: Fall;Anterior Hip Restrictions Weight Bearing Restrictions: Yes LLE Weight Bearing: Weight bearing as tolerated      Mobility  Bed Mobility Overal bed mobility: Modified Independent                Transfers Overall transfer level: Needs assistance Equipment used: Rolling walker (2 wheeled) Transfers: Sit to/from Stand Sit to Stand: Supervision;Min guard         General transfer comment: excessive weight shift to R LE with movement transition; cuing for hand placement  Ambulation/Gait Ambulation/Gait assistance: Min  guard Ambulation Distance (Feet): 130 Feet Assistive device: Rolling walker (2 wheeled)   Gait velocity: 10' walk time, 12 seconds   General Gait Details: step to progressing to step through gait pattern with progressive increase in L LE stance time/weight acceptance.  slow and guarded, but no buckling or LOB.  Good efforts to integrate cuing for postural control/position, stepping pattern and overall cadence.  Stairs            Wheelchair Mobility    Modified Rankin (Stroke Patients Only)       Balance Overall balance assessment: Needs assistance Sitting-balance support: No upper extremity supported;Feet supported Sitting balance-Leahy Scale: Normal     Standing balance support: Bilateral upper extremity supported Standing balance-Leahy Scale: Fair                               Pertinent Vitals/Pain Pain Assessment: 0-10 Pain Score: 3  Pain Location: L hip Pain Descriptors / Indicators: Aching;Grimacing;Guarding;Operative site guarding Pain Intervention(s): Limited activity within patient's tolerance;Monitored during session;Premedicated before session;Repositioned    Home Living Family/patient expects to be discharged to:: Private residence Living Arrangements: Alone Available Help at Discharge: Family Type of Home: House Home Access: Stairs to enter Entrance Stairs-Rails: None Entrance Stairs-Number of Steps: 2 (has post to hold if needed) Home Layout: One level Home Equipment: None      Prior Function Level of Independence: Independent         Comments: Indep with ADLs, household and community mobility; working full-time as Passenger transport manager        Extremity/Trunk Assessment   Upper Extremity Assessment Upper Extremity Assessment: Overall WFL for  tasks assessed    Lower Extremity Assessment Lower Extremity Assessment:  (L hip grossly 3-/5, limited by pain; otherwise, grossly WFL.  Sensation fully returned/intact)        Communication   Communication: No difficulties  Cognition Arousal/Alertness: Awake/alert Behavior During Therapy: WFL for tasks assessed/performed Overall Cognitive Status: Within Functional Limits for tasks assessed                                        General Comments      Exercises Other Exercises Other Exercises: Supine LE therex, 1x10, AROM for muscular strength/endurnace.  Good L LE Lstrength/control noted with all isolated therex. Other Exercises: Verbally reviewed anterior THPs and WBing precautions; will continue to reinforce throughout stay.   Assessment/Plan    PT Assessment Patient needs continued PT services  PT Problem List Decreased strength;Decreased range of motion;Decreased activity tolerance;Decreased balance;Decreased mobility;Decreased coordination;Decreased knowledge of precautions;Pain       PT Treatment Interventions DME instruction;Gait training;Stair training;Functional mobility training;Therapeutic activities;Therapeutic exercise;Balance training;Patient/family education    PT Goals (Current goals can be found in the Care Plan section)  Acute Rehab PT Goals Patient Stated Goal: to return home PT Goal Formulation: With patient/family Time For Goal Achievement: 08/07/17 Potential to Achieve Goals: Good    Frequency BID   Barriers to discharge        Co-evaluation               AM-PAC PT "6 Clicks" Daily Activity  Outcome Measure Difficulty turning over in bed (including adjusting bedclothes, sheets and blankets)?: None Difficulty moving from lying on back to sitting on the side of the bed? : None Difficulty sitting down on and standing up from a chair with arms (e.g., wheelchair, bedside commode, etc,.)?: Unable Help needed moving to and from a bed to chair (including a wheelchair)?: A Little Help needed walking in hospital room?: A Little Help needed climbing 3-5 steps with a railing? : A Little 6 Click Score: 18     End of Session Equipment Utilized During Treatment: Gait belt Activity Tolerance: Patient tolerated treatment well Patient left: in chair;with call bell/phone within reach;with chair alarm set;with family/visitor present Nurse Communication: Mobility status PT Visit Diagnosis: Muscle weakness (generalized) (M62.81);Difficulty in walking, not elsewhere classified (R26.2);Pain Pain - Right/Left: Left Pain - part of body: Hip    Time: 0922-0942 PT Time Calculation (min) (ACUTE ONLY): 20 min   Charges:   PT Evaluation $PT Eval Low Complexity: 1 Low PT Treatments $Therapeutic Exercise: 8-22 mins   PT G Codes:   PT G-Codes **NOT FOR INPATIENT CLASS** Functional Assessment Tool Used: AM-PAC 6 Clicks Basic Mobility Functional Limitation: Mobility: Walking and moving around Mobility: Walking and Moving Around Current Status (W6203): At least 20 percent but less than 40 percent impaired, limited or restricted Mobility: Walking and Moving Around Goal Status [phone]): At least 1 percent but less than 20 percent impaired, limited or restricted    Jenavi Beedle H. Manson Passey, PT, DPT, NCS 07/24/17, 9:55 AM [phone]

## 2017-07-24 NOTE — NC FL2 (Signed)
[place] MEDICAID FL2 LEVEL OF CARE SCREENING TOOL     IDENTIFICATION  Patient Name: Arthur Willis Birthdate: [birth year] Sex: male Admission Date (Current Location): 07/23/2017  [place] and [place] Number:  Chiropodist and Address:  [facility], [address], [place], [place] [address]      Provider Number: [id number]  Attending Physician Name and Address:  Kennedy Bucker, MD  Relative Name and Phone Number:       Current Level of Care: Hospital Recommended Level of Care: Skilled Nursing Facility Prior Approval Number:    Date Approved/Denied:   PASRR Number:  [id number])  Discharge Plan: SNF    Current Diagnoses: Patient Active Problem List   Diagnosis Date Noted  . Primary osteoarthritis of left hip 07/23/2017    Orientation RESPIRATION BLADDER Height & Weight     Self, Time, Situation, Place  Normal Continent Weight: 217 lb (98.4 kg) Height:  6' (182.9 cm)  BEHAVIORAL SYMPTOMS/MOOD NEUROLOGICAL BOWEL NUTRITION STATUS   (none)  (none) Continent Diet (Regular Diet )  AMBULATORY STATUS COMMUNICATION OF NEEDS Skin   Extensive Assist Verbally Surgical wounds (Incision: Left Hip. )                       Personal Care Assistance Level of Assistance  Bathing, Feeding, Dressing Bathing Assistance: Limited assistance Feeding assistance: Independent Dressing Assistance: Limited assistance     Functional Limitations Info  Sight, Hearing, Speech Sight Info: Adequate Hearing Info: Adequate Speech Info: Adequate    SPECIAL CARE FACTORS FREQUENCY  PT (By licensed PT), OT (By licensed OT)     PT Frequency:  (5) OT Frequency:  (5)            Contractures      Additional Factors Info  Code Status, Allergies Code Status Info:  (Full Code. ) Allergies Info:  (No Known Allergies. )           Current Medications (07/24/2017):  This is the current hospital active medication list Current Facility-Administered  Medications  Medication Dose Route Frequency Provider Last Rate Last Dose  . acetaminophen (TYLENOL) tablet 650 mg  650 mg Oral Q6H PRN Kennedy Bucker, MD       Or  . acetaminophen (TYLENOL) suppository 650 mg  650 mg Rectal Q6H PRN Kennedy Bucker, MD      . alum & mag hydroxide-simeth (MAALOX/MYLANTA) 200-200-20 MG/5ML suspension 30 mL  30 mL Oral Q4H PRN Kennedy Bucker, MD      . bisacodyl (DULCOLAX) suppository 10 mg  10 mg Rectal Daily PRN Kennedy Bucker, MD      . ceFAZolin (ANCEF) IVPB 2g/100 mL premix  2 g Intravenous Q6H Kennedy Bucker, MD 200 mL/hr at 07/24/17 0852 2 g at 07/24/17 0852  . diphenhydrAMINE (BENADRYL) 12.5 MG/5ML elixir 12.5-25 mg  12.5-25 mg Oral Q4H PRN Kennedy Bucker, MD      . docusate sodium (COLACE) capsule 100 mg  100 mg Oral BID Kennedy Bucker, MD   100 mg at 07/24/17 3662  . enoxaparin (LOVENOX) injection 40 mg  40 mg Subcutaneous Q24H Kennedy Bucker, MD   40 mg at 07/24/17 0730  . ferrous fumarate-b12-vitamic C-folic acid (TRINSICON / FOLTRIN) capsule 1 capsule  1 capsule Oral TID PC Kennedy Bucker, MD   1 capsule at 07/24/17 0902  . LORazepam (ATIVAN) tablet 1 mg  1 mg Oral Q4H PRN Kennedy Bucker, MD      . magnesium citrate solution 1 Bottle  1 Bottle Oral Once PRN Kennedy Bucker, MD      . magnesium hydroxide (MILK OF MAGNESIA) suspension 30 mL  30 mL Oral Daily PRN Kennedy Bucker, MD   30 mL at 07/24/17 0852  . menthol-cetylpyridinium (CEPACOL) lozenge 3 mg  1 lozenge Oral PRN Kennedy Bucker, MD       Or  . phenol (CHLORASEPTIC) mouth spray 1 spray  1 spray Mouth/Throat PRN Kennedy Bucker, MD      . methocarbamol (ROBAXIN) tablet 500 mg  500 mg Oral Q6H PRN Kennedy Bucker, MD   500 mg at 07/24/17 4540   Or  . methocarbamol (ROBAXIN) 500 mg in dextrose 5 % 50 mL IVPB  500 mg Intravenous Q6H PRN Kennedy Bucker, MD      . metoCLOPramide (REGLAN) tablet 5-10 mg  5-10 mg Oral Q8H PRN Kennedy Bucker, MD       Or  . metoCLOPramide (REGLAN) injection 5-10 mg  5-10 mg Intravenous  Q8H PRN Kennedy Bucker, MD      . morphine 2 MG/ML injection 2 mg  2 mg Intravenous Q2H PRN Kennedy Bucker, MD   2 mg at 07/24/17 0135  . multivitamin with minerals tablet 1 tablet  1 tablet Oral Daily Kennedy Bucker, MD   1 tablet at 07/24/17 [phone]  . ondansetron (ZOFRAN) tablet 4 mg  4 mg Oral Q6H PRN Kennedy Bucker, MD       Or  . ondansetron [facility] [place]) injection 4 mg  4 mg Intravenous Q6H PRN Kennedy Bucker, MD      . oxyCODONE (Oxy IR/ROXICODONE) immediate release tablet 5-10 mg  5-10 mg Oral Q3H PRN Kennedy Bucker, MD   10 mg at 07/24/17 0730  . zolpidem (AMBIEN) tablet 5 mg  5 mg Oral QHS PRN,MR X 1 Kennedy Bucker, MD         Discharge Medications: Please see discharge summary for a list of discharge medications.  Relevant Imaging Results:  Relevant Lab Results:   Additional Information  (SSN: [id number])  Itati Brocksmith, Darleen Crocker, LCSW

## 2017-07-24 NOTE — Care Management Note (Addendum)
Case Management Note  Patient Details  Name: Arthur Willis MRN: [id number] Date of Birth: 05-Nov-1961  Subjective/Objective:  POD # 1 left THA. Met with patient and his son at bedside.Son and daughter will be helping patient s/p discharge. Patient lives alone and was independent prior to admission.   Discussed PT recommendations of outpatient PT. Patient is agreeable. First OP PT appointment tentatively scheduled for Friday, August 24 @ 7 am. Son updated and is agreeable to date and time if patient discharges tomorrow. Patient states he has assistance getting to and from OP PT. Patient will need a BSC and a walker. [facility]dered from [place] [facility] [place] [phone]. Called Lovenox 40 mg # 14 no refills.                     Action/Plan: OP PT @ [facility]. DME from [facility], Lovenox called in.   Expected Discharge Date:                  Expected Discharge Plan:  OP Rehab  In-House Referral:     Discharge planning Services  CM Consult  Post Acute Care Choice:  Durable Medical Equipment Choice offered to:  Patient  DME Arranged:  Bedside commode, Walker rolling DME Agency:  [place]:    [place] Agency:     Status of Service:  In process, will continue to follow  If discussed at Long Length of Stay Meetings, dates discussed:    Additional Comments:  Jolly Mango, RN 07/24/2017, 12:18 PM

## 2017-07-25 LAB — CBC
HCT: 25.8 % — ABNORMAL LOW (ref 40.0–52.0)
HEMOGLOBIN: 8.3 g/dL — AB (ref 13.0–18.0)
MCH: 23.2 pg — AB (ref 26.0–34.0)
MCHC: 32.3 g/dL (ref 32.0–36.0)
MCV: 71.8 fL — AB (ref 80.0–100.0)
Platelets: 454 10*3/uL — ABNORMAL HIGH (ref 150–440)
RBC: 3.59 MIL/uL — ABNORMAL LOW (ref 4.40–5.90)
RDW: 17.2 % — ABNORMAL HIGH (ref 11.5–14.5)
WBC: 10.8 10*3/uL — ABNORMAL HIGH (ref 3.8–10.6)

## 2017-07-25 LAB — BASIC METABOLIC PANEL
Anion gap: 6 (ref 5–15)
BUN: 14 mg/dL (ref 6–20)
CHLORIDE: 100 mmol/L — AB (ref 101–111)
CO2: 28 mmol/L (ref 22–32)
CREATININE: 0.74 mg/dL (ref 0.61–1.24)
Calcium: 8.3 mg/dL — ABNORMAL LOW (ref 8.9–10.3)
GFR calc Af Amer: >60 mL/min (ref >60)
GFR calc non Af Amer: >60 mL/min (ref >60)
GLUCOSE: 112 mg/dL — AB (ref 65–99)
Potassium: 3.6 mmol/L (ref 3.5–5.1)
SODIUM: 134 mmol/L — AB (ref 135–145)

## 2017-07-25 LAB — URINE CULTURE: Culture: NO GROWTH

## 2017-07-25 LAB — SURGICAL PATHOLOGY

## 2017-07-25 MED ORDER — OXYCODONE HCL 5 MG PO TABS: 5.0000 mg | ORAL_TABLET | ORAL | 0 refills | Status: DC | PRN

## 2017-07-25 MED ORDER — METHOCARBAMOL 500 MG PO TABS: 500.0000 mg | ORAL_TABLET | Freq: Four times a day (QID) | ORAL | 0 refills | Status: DC | PRN

## 2017-07-25 MED ORDER — ENOXAPARIN SODIUM 40 MG/0.4ML ~~LOC~~ SOLN: 40.0000 mg | SUBCUTANEOUS | 0 refills | Status: DC

## 2017-07-25 NOTE — Care Management Note (Signed)
Case Management Note  Patient Details  Name: Tej Klindt MRN: [id number] Date of Birth: 08-28-1961  Subjective/Objective:   Discharge Today.                  Action/Plan: Cost of Lovenox is $90. Patient updated and denies issues paying for medication. Reminded him of first OP PT appointment. He verbalized understanding. DME to be delivered prior to discharge.   Expected Discharge Date:  07/25/17               Expected Discharge Plan:  OP Rehab  In-House Referral:     Discharge planning Services  CM Consult  Post Acute Care Choice:  Durable Medical Equipment Choice offered to:  Patient  DME Arranged:  Bedside commode, Walker rolling DME Agency:  [facility]  HH Arranged:    [facility] Agency:     Status of Service:  Completed, signed off  If discussed at [employer] of Stay Meetings, dates discussed:    Additional Comments:  Marily Memos, RN 07/25/2017, 8:36 AM

## 2017-07-25 NOTE — Progress Notes (Signed)
Physical Therapy Treatment Patient Details Name: Arthur Willis MRN: [id number] DOB: Jun 18, 1961 Today's Date: 07/25/2017    History of Present Illness admitted for acute hospitalization status post L THR (07/23/17), anterior approach, WBAT.    PT Comments    Pt presented returning to bathroom agreeable to therapy. Pt ambulated >379ft with slight antalgic gait and increasing gait speed. Upon return to room pt participated in seated/standing therex with good tolerance. Pt required intermittent rest breaks due to fatigue and indicated no significant increase in pain with activity. He anticipates d/c this morning and all questions/concerns addressed. Per pt will initiate OPPT tomorrow and would continue from skilled services to increase ROM strength and return to PLOF.   Follow Up Recommendations  Outpatient PT     Equipment Recommendations  Rolling walker with 5" wheels    Recommendations for Other Services       Precautions / Restrictions Precautions Precautions: Fall;Anterior Hip Restrictions Weight Bearing Restrictions: Yes LLE Weight Bearing: Weight bearing as tolerated    Mobility  Bed Mobility               General bed mobility comments: NT, pt coming out of bathroom  Transfers Overall transfer level: Modified independent Equipment used: Rolling walker (2 wheeled) Transfers: Sit to/from Stand Sit to Stand: Modified independent (Device/Increase time)         General transfer comment: demonstrating good safety with transfers  Ambulation/Gait Ambulation/Gait assistance: Supervision Ambulation Distance (Feet): 350 Feet Assistive device: Rolling walker (2 wheeled) Gait Pattern/deviations: Step-to pattern   Gait velocity interpretation: Below normal speed for age/gender General Gait Details: slight antalgic gait    Stairs            Wheelchair Mobility    Modified Rankin (Stroke Patients Only)       Balance Overall balance assessment: Needs  assistance Sitting-balance support: No upper extremity supported Sitting balance-Leahy Scale: Normal     Standing balance support: Bilateral upper extremity supported Standing balance-Leahy Scale: Fair                              Cognition Arousal/Alertness: Awake/alert Behavior During Therapy: WFL for tasks assessed/performed Overall Cognitive Status: Within Functional Limits for tasks assessed                                        Exercises Total Joint Exercises Ankle Circles/Pumps: AROM;Strengthening;15 reps;Seated Gluteal Sets: Strengthening;Both;15 reps;Seated Hip ABduction/ADduction: AROM;Strengthening;Both;10 reps;Standing Long Arc Quad: AROM;Strengthening;Both;15 reps;Seated Marching in Standing: AROM;Strengthening;Both;10 reps;Standing    General Comments        Pertinent Vitals/Pain Pain Assessment: 0-10    Home Living                      Prior Function            PT Goals (current goals can now be found in the care plan section) Acute Rehab PT Goals Patient Stated Goal: to return home    Frequency    BID      PT Plan Current plan remains appropriate    Co-evaluation              AM-PAC PT "6 Clicks" Daily Activity  Outcome Measure  Difficulty turning over in bed (including adjusting bedclothes, sheets and blankets)?: None Difficulty moving from lying on back to sitting on  the side of the bed? : None Difficulty sitting down on and standing up from a chair with arms (e.g., wheelchair, bedside commode, etc,.)?: None Help needed moving to and from a bed to chair (including a wheelchair)?: None Help needed walking in hospital room?: None Help needed climbing 3-5 steps with a railing? : A Little 6 Click Score: 23    End of Session Equipment Utilized During Treatment: Gait belt Activity Tolerance: Patient tolerated treatment well Patient left: in chair;with call bell/phone within reach   PT Visit  Diagnosis: Muscle weakness (generalized) (M62.81) Pain - Right/Left: Left Pain - part of body: Hip     Time: 8119-1478 PT Time Calculation (min) (ACUTE ONLY): 23 min  Charges:  $Gait Training: 8-22 mins $Therapeutic Exercise: 8-22 mins                       Akif Weldy  Rayna Brenner, PTA 07/25/2017, 9:42 AM

## 2017-07-25 NOTE — Discharge Summary (Signed)
Physician Discharge Summary  Patient ID: Arthur Willis MRN: [id number] DOB/AGE: 03/25/1961 56 y.o.  Admit date: 07/23/2017 Discharge date: 07/25/2017  Admission Diagnoses:  PRIMARY LOCALIZED OSTEOARTHRITIS OF LEFT HIP   Discharge Diagnoses: Patient Active Problem List   Diagnosis Date Noted  . Primary osteoarthritis of left hip 07/23/2017    Past Medical History:  Diagnosis Date  . Arthritis      Transfusion: none   Consultants (if any):   Discharged Condition: Improved  Hospital Course: Arthur Willis is an 56 y.o. male who was admitted 07/23/2017 with a diagnosis of left hip osteoarthritis and went to the operating room on 07/23/2017 and underwent the above named procedures.    Surgeries: Procedure(s): TOTAL HIP ARTHROPLASTY ANTERIOR APPROACH on 07/23/2017 Patient tolerated the surgery well. Taken to PACU where she was stabilized and then transferred to the orthopedic floor.  Started on Lovenox 40 q 24 hrs. Foot pumps applied bilaterally at 80 mm. Heels elevated on bed with rolled towels. No evidence of DVT. Negative Homan. Physical therapy started on day #1 for gait training and transfer. OT started day #1 for ADL and assisted devices.  Patient's foley was d/c on day #1. Patient's IV was d/c on day #2.  On post op day #2 patient was stable and ready for discharge to home with HHPT.  Implants: Medacta 58 Mpact DM cup with liner, Depuy 6 Actis stem with +8.5 ceramic head  He was given perioperative antibiotics:  Anti-infectives    Start     Dose/Rate Route Frequency Ordered Stop   07/23/17 2100  ceFAZolin (ANCEF) IVPB 2g/100 mL premix     2 g 200 mL/hr over 30 Minutes Intravenous Every 6 hours 07/23/17 1814 07/24/17 0926   07/23/17 1400  ceFAZolin (ANCEF) IVPB 2g/100 mL premix     2 g 200 mL/hr over 30 Minutes Intravenous  Once 07/23/17 1315 07/23/17 1530   07/23/17 1319  ceFAZolin (ANCEF) 2-4 GM/100ML-% IVPB    Comments:  Slemenda, Debra   : cabinet override   07/23/17 1319 07/23/17 1534    .  He was given sequential compression devices, early ambulation, and Lovenox for DVT prophylaxis.  He benefited maximally from the hospital stay and there were no complications.    Recent vital signs:  Vitals:   07/24/17 1703 07/24/17 1925  BP: 137/83 (!) 155/86  Pulse: 98 (!) 102  Resp: 16 18  Temp: 99.8 F (37.7 C) 98.4 F (36.9 C)  SpO2: 97% 99%    Recent laboratory studies:  Lab Results  Component Value Date   HGB 8.3 (L) 07/25/2017   HGB 8.0 (L) 07/24/2017   HGB 9.8 (L) 07/23/2017   Lab Results  Component Value Date   WBC 10.8 (H) 07/25/2017   PLT 454 (H) 07/25/2017   Lab Results  Component Value Date   INR 1.01 07/23/2017   Lab Results  Component Value Date   NA 134 (L) 07/25/2017   K 3.6 07/25/2017   CL 100 (L) 07/25/2017   CO2 28 07/25/2017   BUN 14 07/25/2017   CREATININE 0.74 07/25/2017   GLUCOSE 112 (H) 07/25/2017    Discharge Medications:   Allergies as of 07/25/2017   No Known Allergies     Medication List    STOP taking these medications   diclofenac 75 MG EC tablet Commonly known as:  VOLTAREN   naproxen sodium 220 MG tablet Commonly known as:  ANAPROX   traMADol 50 MG tablet Commonly known as:  Janean Sark  TAKE these medications   BC FAST PAIN RELIEF 845-65 MG Pack Generic drug:  Aspirin-Caffeine Take 1 packet by mouth daily as needed (for pain.).   enoxaparin 40 MG/0.4ML injection Commonly known as:  LOVENOX Inject 0.4 mLs (40 mg total) into the skin daily.   methocarbamol 500 MG tablet Commonly known as:  ROBAXIN Take 1 tablet (500 mg total) by mouth every 6 (six) hours as needed for muscle spasms.   multivitamin with minerals Tabs tablet Take 1 tablet by mouth daily.   oxyCODONE 5 MG immediate release tablet Commonly known as:  Oxy IR/ROXICODONE Take 1-2 tablets (5-10 mg total) by mouth every 4 (four) hours as needed for breakthrough pain.   predniSONE 20 MG tablet Commonly known as:   DELTASONE Take 20 mg by mouth daily with breakfast.            Durable Medical Equipment        Start     Ordered   07/23/17 1814  DME Walker rolling  Once    Question:  Patient needs a walker to treat with the following condition  Answer:  Status post total hip replacement, left   07/23/17 1814   07/23/17 1814  DME 3 n 1  Once     07/23/17 1814   07/23/17 1814  DME Bedside commode  Once    Question:  Patient needs a bedside commode to treat with the following condition  Answer:  Status post total hip replacement, left   07/23/17 1814       Discharge Care Instructions        Start     Ordered   07/25/17 0000  oxyCODONE (OXY IR/ROXICODONE) 5 MG immediate release tablet  Every 4 hours PRN    Question:  Supervising Provider  Answer:  Kennedy Bucker   07/25/17 0714   07/25/17 0000  methocarbamol (ROBAXIN) 500 MG tablet  Every 6 hours PRN    Question:  Supervising Provider  Answer:  Kennedy Bucker   07/25/17 0714   07/25/17 0000  enoxaparin (LOVENOX) 40 MG/0.4ML injection  Every 24 hours    Question:  Supervising Provider  Answer:  Kennedy Bucker   07/25/17 [id number]      Diagnostic Studies: Dg Hip Operative Unilat W Or W/o Pelvis Left  Result Date: 07/23/2017 CLINICAL DATA:  Total left hip replacement. EXAM: OPERATIVE LEFT HIP (WITH PELVIS IF PERFORMED) 3 VIEWS TECHNIQUE: Fluoroscopic spot image(s) were submitted for interpretation post-operatively. COMPARISON:  No recent prior . FINDINGS: Total left hip replacement. Anatomic alignment. No acute bony abnormality. IMPRESSION: Total left hip replacement.  No acute abnormality. Electronically Signed   By: Maisie Fus  Register   On: 07/23/2017 16:54   Dg Hip Unilat W Or W/o Pelvis 2-3 Views Left  Result Date: 07/23/2017 CLINICAL DATA:  Status post left hip replacement. EXAM: DG HIP (WITH OR WITHOUT PELVIS) 2-3V LEFT COMPARISON:  Intraoperative radiograph from the same date. FINDINGS: Post recent total left hip arthroplasty with normal  alignment of the orthopedic hardware. No evidence of fracture. Expected postsurgical changes with soft tissue emphysema. IMPRESSION: Post total left hip arthroplasty without evidence of immediate complications. Electronically Signed   By: Ted Mcalpine M.D.   On: 07/23/2017 17:55    Disposition: Final discharge disposition not confirmed    Follow-up Information    [facility] Follow up on 07/26/2017.   Why:  First OP PT appointment is at 7 am.  Contact information: [address] [facility] [place]  [address] [phone]        Kennedy Bucker, MD Follow up in 2 week(s).   Specialty:  Orthopedic Surgery Contact information: [address] [place] [address] [phone]            Signed: Patience Musca 07/25/2017, 7:15 AM

## 2017-07-25 NOTE — Progress Notes (Signed)
Discharge instructions and medication details reviewed with patient. Lovenox education provided, patient self administered inj satisfactory. Printed prescriptions for Lovenox, Robaxin and Oxycodone given to patient as well as AVS. VS stable at this time. IV removed. All belongings packed including walker and BSC. Patient escorted out via wheelchair.   Stephannie Peters, RN

## 2017-07-25 NOTE — Progress Notes (Signed)
CH made initial visit to Pt in [facility] 23. Pt was awaiting a procedure. Pt stated he is hungry and would like a cheese burger. CH offered prayer instead. Pt stated while it was not as satisfying, it was appreciated.    07/25/17 1200  Clinical Encounter Type  Visited With Patient;Patient and family together;Health care provider  Visit Type Initial;Spiritual support;Pre-op  Referral From Nurse  Consult/Referral To Chaplain  Spiritual Encounters  Spiritual Needs Prayer

## 2017-07-25 NOTE — Discharge Instructions (Signed)

## 2017-07-25 NOTE — Progress Notes (Signed)
   Subjective: 2 Days Post-Op Procedure(s) (LRB): TOTAL HIP ARTHROPLASTY ANTERIOR APPROACH (Left) Patient reports pain as mild.   Patient is well, and has had no acute complaints or problems. Tolerated PT well, no dizziness with PT. Denies any CP, SOB, ABD pain. We will continue therapy today.  Plan is to go Home after hospital stay.  Objective: Vital signs in last 24 hours: Temp:  [98 F (36.7 C)-99.8 F (37.7 C)] 98.4 F (36.9 C) (08/22 1925) Pulse Rate:  [86-102] 102 (08/22 1925) Resp:  [16-18] 18 (08/22 1925) BP: (137-155)/(83-90) 155/86 (08/22 1925) SpO2:  [97 %-100 %] 99 % (08/22 1925)  Intake/Output from previous day: 08/22 0701 - 08/23 0700 In: -  Out: 1150 [Urine:1150] Intake/Output this shift: No intake/output data recorded.   Recent Labs  07/23/17 1251 07/24/17 0423 07/25/17 0505  HGB 9.8* 8.0* 8.3*    Recent Labs  07/24/17 0423 07/25/17 0505  WBC 10.6 10.8*  RBC 3.50* 3.59*  HCT 25.1* 25.8*  PLT 386 454*    Recent Labs  07/24/17 0423 07/25/17 0505  NA 136 134*  K 3.6 3.6  CL 102 100*  CO2 27 28  BUN 19 14  CREATININE 0.92 0.74  GLUCOSE 128* 112*  CALCIUM 8.2* 8.3*    Recent Labs  07/23/17 1251  INR 1.01    EXAM General - Patient is Alert, Appropriate and Oriented Extremity - Neurovascular intact Sensation intact distally Intact pulses distally Dorsiflexion/Plantar flexion intact No cellulitis present Compartment soft Dressing - dressing C/D/I and no drainage Motor Function - intact, moving foot and toes well on exam.   Past Medical History:  Diagnosis Date  . Arthritis     Assessment/Plan:   2 Days Post-Op Procedure(s) (LRB): TOTAL HIP ARTHROPLASTY ANTERIOR APPROACH (Left) Active Problems:   Primary osteoarthritis of left hip  Estimated body mass index is 29.43 kg/m as calculated from the following:   Height as of this encounter: 6' (1.829 m).   Weight as of this encounter: 98.4 kg (217 lb). Advance diet Up with  therapy   Needs BM before discharge  Acute post op blood loss anemia with underlying chronic anemia- continue with Iron. Patient asymptomatic. Hgb trending up, Hgb 8.3  Discharge home with HHPT today pending BM  DVT Prophylaxis - Lovenox, Foot Pumps and TED hose Weight-Bearing as tolerated to left leg   T. Cranston Neighbor, PA-C [facility] 07/25/2017, 7:11 AM

## 2017-07-25 NOTE — Anesthesia Postprocedure Evaluation (Signed)
Anesthesia Post Note  Patient: Glean [place]  Procedure(s) Performed: Procedure(s) (LRB): TOTAL HIP ARTHROPLASTY ANTERIOR APPROACH (Left)  Patient location during evaluation: Nursing Unit Anesthesia Type: Spinal Level of consciousness: awake, awake and alert and oriented Pain management: pain level controlled Vital Signs Assessment: post-procedure vital signs reviewed and stable Respiratory status: spontaneous breathing and nonlabored ventilation Cardiovascular status: stable Postop Assessment: no headache, no backache, patient able to bend at knees, no signs of nausea or vomiting and adequate PO intake Anesthetic complications: no     Last Vitals:  Vitals:   07/24/17 1703 07/24/17 1925  BP: 137/83 (!) 155/86  Pulse: 98 (!) 102  Resp: 16 18  Temp: 37.7 C 36.9 C  SpO2: 97% 99%    Last Pain:  Vitals:   07/25/17 0300  TempSrc:   PainSc: Asleep                 Micaiah Remillard,  Katlin Ciszewski R

## 2017-07-26 DIAGNOSIS — Z96642 Presence of left artificial hip joint: Secondary | ICD-10-CM | POA: Diagnosis not present

## 2017-07-30 DIAGNOSIS — Z96642 Presence of left artificial hip joint: Secondary | ICD-10-CM | POA: Diagnosis not present

## 2017-07-30 DIAGNOSIS — M25552 Pain in left hip: Secondary | ICD-10-CM | POA: Diagnosis not present

## 2017-08-02 DIAGNOSIS — Z96642 Presence of left artificial hip joint: Secondary | ICD-10-CM | POA: Diagnosis not present

## 2017-08-06 DIAGNOSIS — D649 Anemia, unspecified: Secondary | ICD-10-CM | POA: Diagnosis not present

## 2017-08-06 DIAGNOSIS — M25552 Pain in left hip: Secondary | ICD-10-CM | POA: Diagnosis not present

## 2017-08-21 DIAGNOSIS — E538 Deficiency of other specified B group vitamins: Secondary | ICD-10-CM | POA: Diagnosis not present

## 2017-08-21 DIAGNOSIS — D500 Iron deficiency anemia secondary to blood loss (chronic): Secondary | ICD-10-CM | POA: Diagnosis not present

## 2017-08-21 DIAGNOSIS — K2960 Other gastritis without bleeding: Secondary | ICD-10-CM | POA: Diagnosis not present

## 2017-09-09 DIAGNOSIS — Z96642 Presence of left artificial hip joint: Secondary | ICD-10-CM | POA: Diagnosis not present

## 2017-09-20 DIAGNOSIS — D500 Iron deficiency anemia secondary to blood loss (chronic): Secondary | ICD-10-CM | POA: Diagnosis not present

## 2017-09-20 DIAGNOSIS — E538 Deficiency of other specified B group vitamins: Secondary | ICD-10-CM | POA: Diagnosis not present

## 2018-03-25 DIAGNOSIS — B029 Zoster without complications: Secondary | ICD-10-CM | POA: Diagnosis not present

## 2018-05-27 DIAGNOSIS — H40003 Preglaucoma, unspecified, bilateral: Secondary | ICD-10-CM | POA: Diagnosis not present

## 2018-06-17 DIAGNOSIS — H2513 Age-related nuclear cataract, bilateral: Secondary | ICD-10-CM | POA: Diagnosis not present

## 2018-06-30 DIAGNOSIS — H2511 Age-related nuclear cataract, right eye: Secondary | ICD-10-CM | POA: Diagnosis not present

## 2018-06-30 DIAGNOSIS — H25811 Combined forms of age-related cataract, right eye: Secondary | ICD-10-CM | POA: Diagnosis not present

## 2018-07-14 DIAGNOSIS — H25812 Combined forms of age-related cataract, left eye: Secondary | ICD-10-CM | POA: Diagnosis not present

## 2018-07-14 DIAGNOSIS — H2512 Age-related nuclear cataract, left eye: Secondary | ICD-10-CM | POA: Diagnosis not present

## 2018-07-23 DIAGNOSIS — E538 Deficiency of other specified B group vitamins: Secondary | ICD-10-CM | POA: Diagnosis not present

## 2018-07-23 DIAGNOSIS — I10 Essential (primary) hypertension: Secondary | ICD-10-CM | POA: Diagnosis not present

## 2018-09-02 IMAGING — XA DG HIP (WITH PELVIS) OPERATIVE*L*
2 series · 6 of 6 positions shown · non-contrast
Comparison: No recent prior .

CLINICAL DATA: Total left hip replacement.

EXAM:
OPERATIVE LEFT HIP (WITH PELVIS IF PERFORMED) 3 VIEWS
TECHNIQUE: Fluoroscopic spot image(s) were submitted for interpretation
post-operatively.

[Series 1: ortho standard · 1 of 1 slices shown (1 of 2)]
[im 1/1]
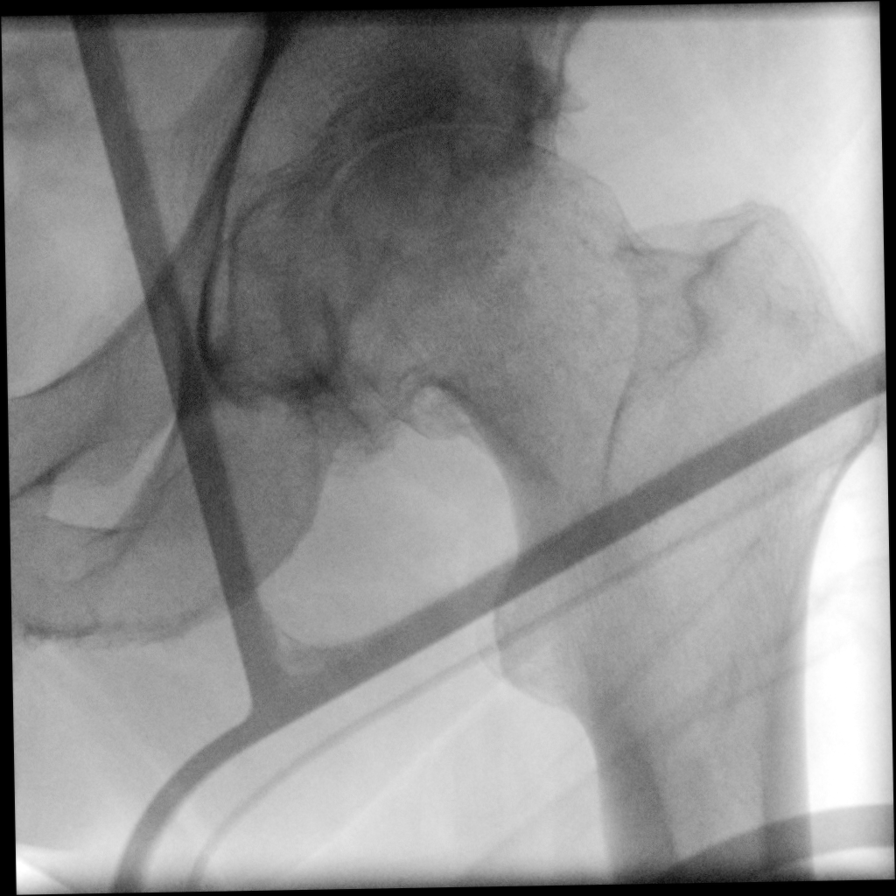

[Series 9: ortho standard · 2 acquisitions, 5 frames shown (2 of 2)]
[im 1/2]
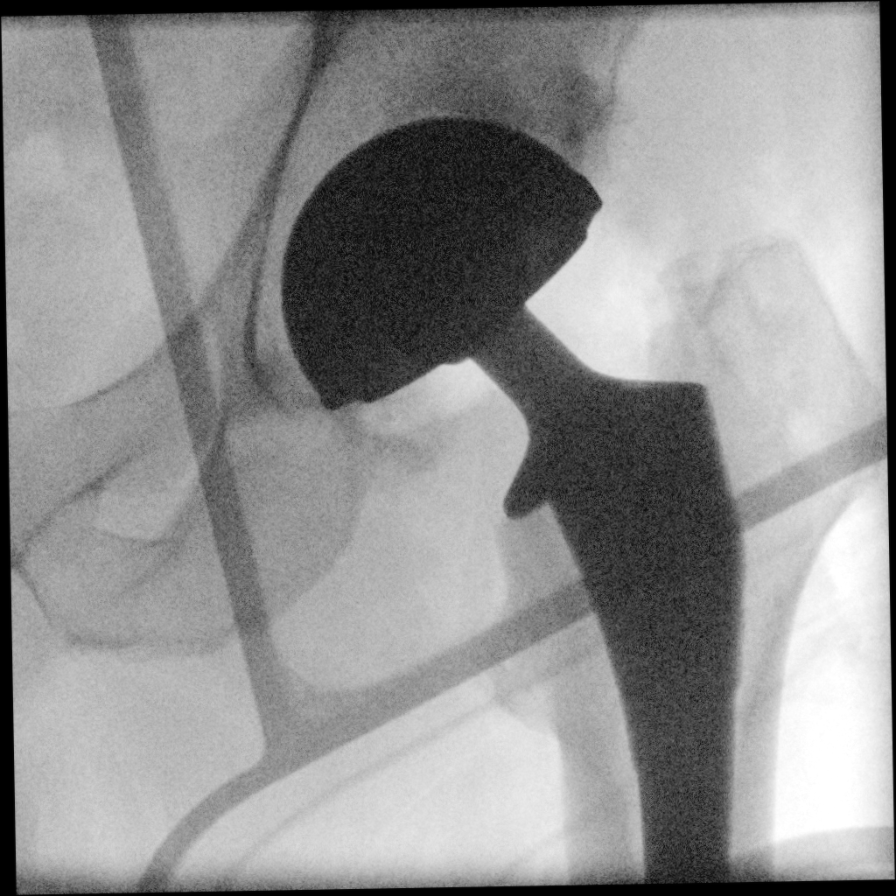
[im 1/2]
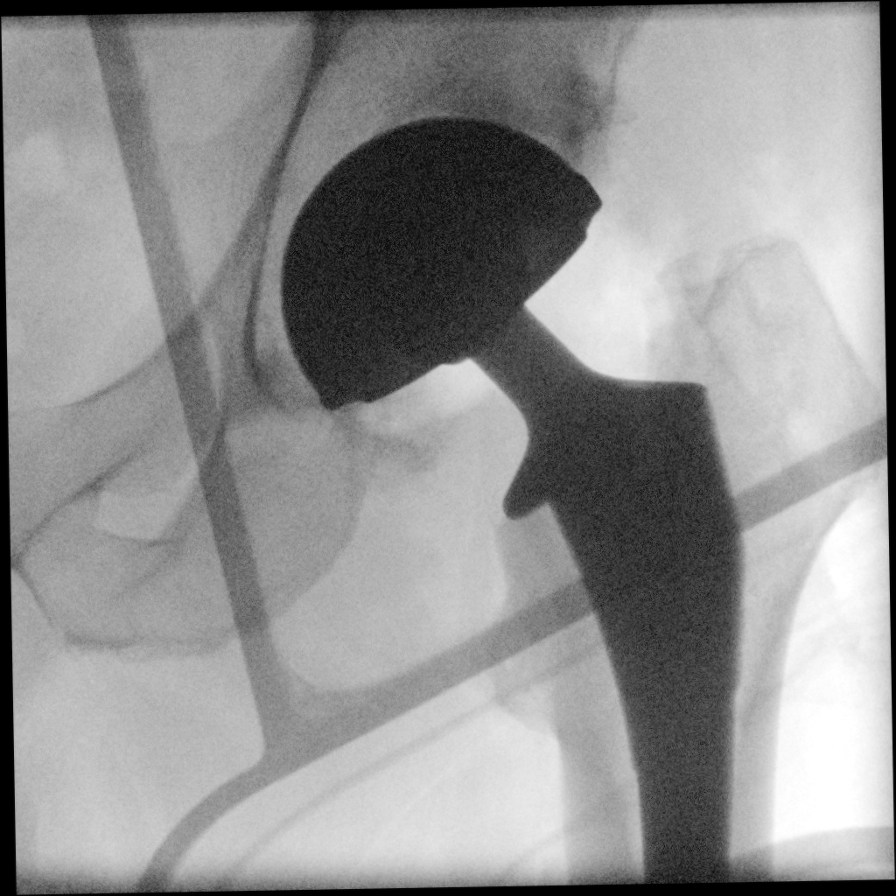
[im 1/2]
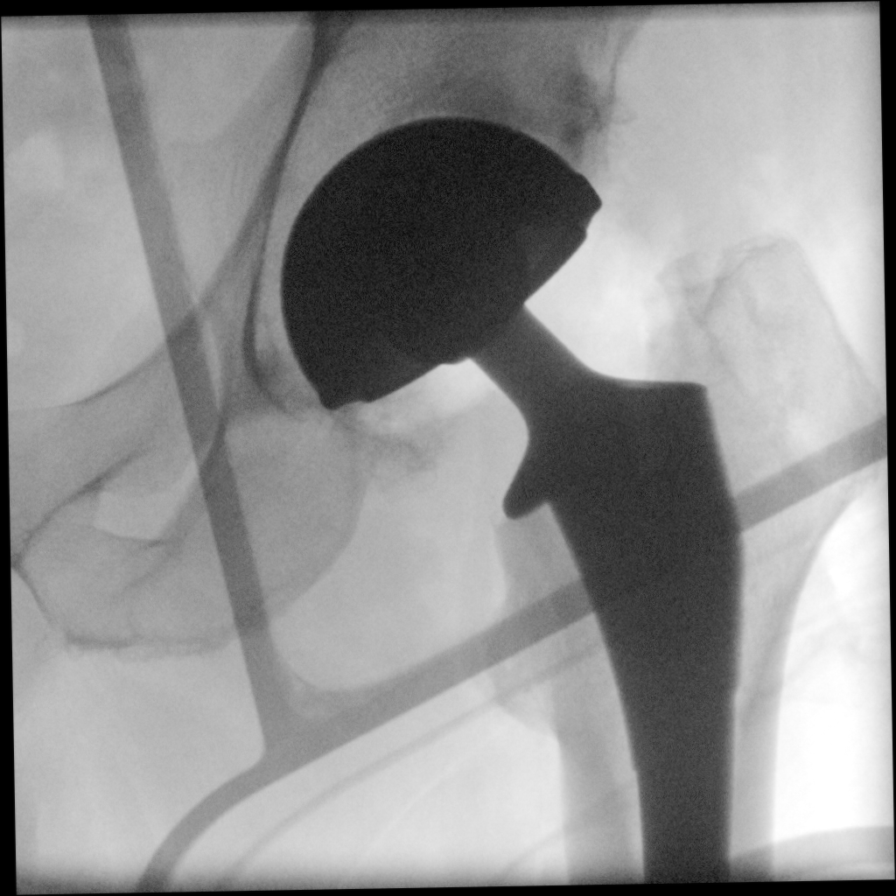
[im 1/2]
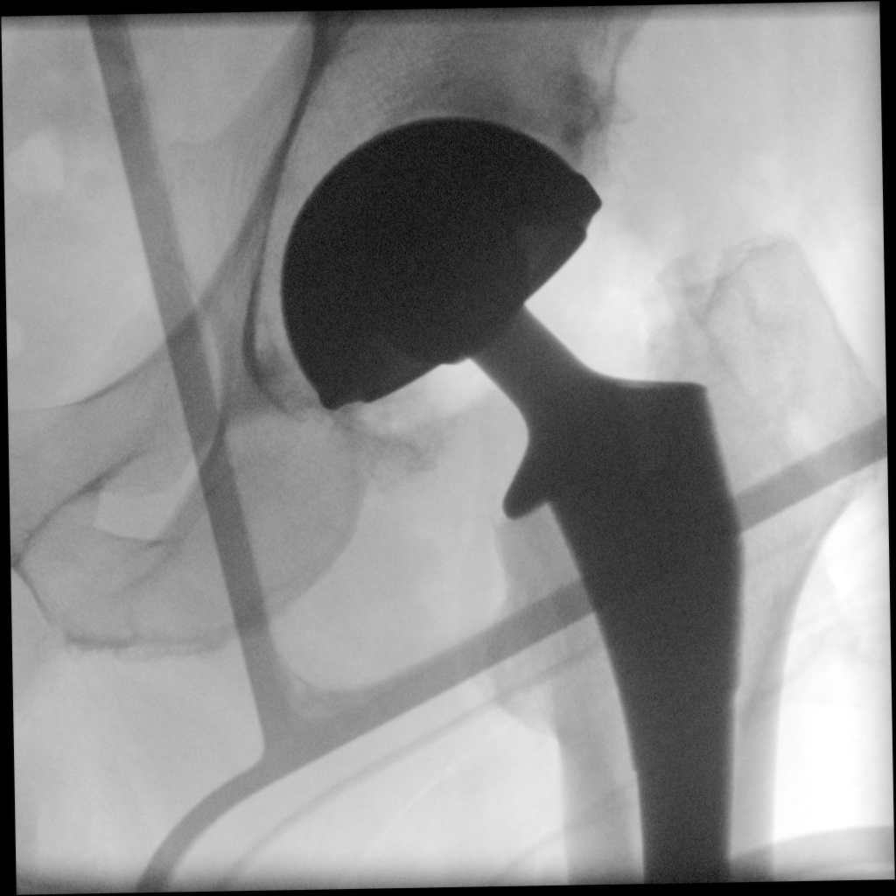
[im 2/2]
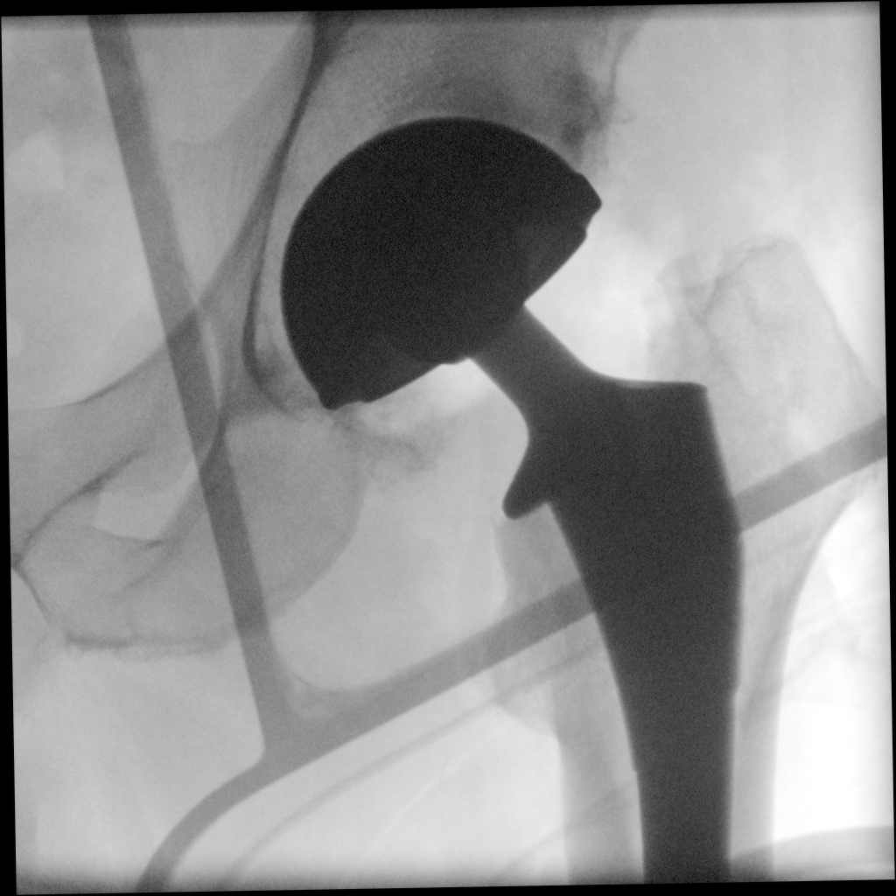

[6 of 6 positions shown; findings below may reference images not displayed]

FINDINGS: Total left hip replacement. Anatomic alignment. No acute bony
abnormality.
IMPRESSION: Total left hip replacement.  No acute abnormality.

## 2018-10-22 DIAGNOSIS — I10 Essential (primary) hypertension: Secondary | ICD-10-CM | POA: Diagnosis not present

## 2018-10-28 DIAGNOSIS — E782 Mixed hyperlipidemia: Secondary | ICD-10-CM | POA: Diagnosis not present

## 2018-10-28 DIAGNOSIS — Z0000 Encounter for general adult medical examination without abnormal findings: Secondary | ICD-10-CM | POA: Diagnosis not present

## 2018-10-28 DIAGNOSIS — E538 Deficiency of other specified B group vitamins: Secondary | ICD-10-CM | POA: Diagnosis not present

## 2018-12-03 DIAGNOSIS — Z8614 Personal history of Methicillin resistant Staphylococcus aureus infection: Secondary | ICD-10-CM

## 2018-12-03 HISTORY — DX: Personal history of Methicillin resistant Staphylococcus aureus infection: Z86.14

## 2018-12-09 DIAGNOSIS — Z1211 Encounter for screening for malignant neoplasm of colon: Secondary | ICD-10-CM | POA: Diagnosis not present

## 2018-12-09 DIAGNOSIS — Z01818 Encounter for other preprocedural examination: Secondary | ICD-10-CM | POA: Diagnosis not present

## 2018-12-26 DIAGNOSIS — Z1211 Encounter for screening for malignant neoplasm of colon: Secondary | ICD-10-CM | POA: Diagnosis not present

## 2018-12-26 DIAGNOSIS — K640 First degree hemorrhoids: Secondary | ICD-10-CM | POA: Diagnosis not present

## 2019-12-01 ENCOUNTER — Ambulatory Visit: Payer: 59 | Attending: Internal Medicine

## 2019-12-01 DIAGNOSIS — Z20822 Contact with and (suspected) exposure to covid-19: Secondary | ICD-10-CM

## 2019-12-02 LAB — NOVEL CORONAVIRUS, NAA: SARS-CoV-2, NAA: DETECTED — AB

## 2019-12-03 ENCOUNTER — Encounter: Payer: Self-pay | Admitting: *Deleted

## 2021-03-27 ENCOUNTER — Other Ambulatory Visit: Payer: Self-pay | Admitting: Orthopedic Surgery

## 2021-04-10 ENCOUNTER — Encounter
Admission: RE | Admit: 2021-04-10 | Discharge: 2021-04-10 | Disposition: A | Discharge status: Home / Self Care | Payer: 59 | Source: Ambulatory Visit | Attending: Orthopedic Surgery | Admitting: Orthopedic Surgery

## 2021-04-10 ENCOUNTER — Other Ambulatory Visit: Payer: Self-pay

## 2021-04-10 DIAGNOSIS — Z01818 Encounter for other preprocedural examination: Secondary | ICD-10-CM | POA: Diagnosis not present

## 2021-04-10 HISTORY — DX: Essential (primary) hypertension: I10

## 2021-04-10 HISTORY — DX: Gastro-esophageal reflux disease without esophagitis: K21.9

## 2021-04-10 LAB — CBC WITH DIFFERENTIAL/PLATELET
Abs Immature Granulocytes: 0.05 10*3/uL (ref 0.00–0.07)
Basophils Absolute: 0.1 10*3/uL (ref 0.0–0.1)
Basophils Relative: 1 %
Eosinophils Absolute: 0.2 10*3/uL (ref 0.0–0.5)
Eosinophils Relative: 2 %
HCT: 43.7 % (ref 39.0–52.0)
Hemoglobin: 15 g/dL (ref 13.0–17.0)
Immature Granulocytes: 1 %
Lymphocytes Relative: 11 %
Lymphs Abs: 0.9 10*3/uL (ref 0.7–4.0)
MCH: 30.7 pg (ref 26.0–34.0)
MCHC: 34.3 g/dL (ref 30.0–36.0)
MCV: 89.4 fL (ref 80.0–100.0)
Monocytes Absolute: 0.7 10*3/uL (ref 0.1–1.0)
Monocytes Relative: 9 %
Neutro Abs: 6.2 10*3/uL (ref 1.7–7.7)
Neutrophils Relative %: 76 %
Platelets: 274 10*3/uL (ref 150–400)
RBC: 4.89 MIL/uL (ref 4.22–5.81)
RDW: 12.2 % (ref 11.5–15.5)
WBC: 8 10*3/uL (ref 4.0–10.5)
nRBC: 0 % (ref 0.0–0.2)

## 2021-04-10 LAB — URINALYSIS, ROUTINE W REFLEX MICROSCOPIC
Bilirubin Urine: NEGATIVE
Glucose, UA: NEGATIVE mg/dL
Hgb urine dipstick: NEGATIVE
Ketones, ur: NEGATIVE mg/dL
Leukocytes,Ua: NEGATIVE
Nitrite: NEGATIVE
Protein, ur: NEGATIVE mg/dL
Specific Gravity, Urine: 1.024 (ref 1.005–1.030)
pH: 5 (ref 5.0–8.0)

## 2021-04-10 LAB — COMPREHENSIVE METABOLIC PANEL
ALT: 28 U/L (ref 0–44)
AST: 27 U/L (ref 15–41)
Albumin: 4.1 g/dL (ref 3.5–5.0)
Alkaline Phosphatase: 38 U/L (ref 38–126)
Anion gap: 10 (ref 5–15)
BUN: 24 mg/dL — ABNORMAL HIGH (ref 6–20)
CO2: 26 mmol/L (ref 22–32)
Calcium: 9.5 mg/dL (ref 8.9–10.3)
Chloride: 99 mmol/L (ref 98–111)
Creatinine, Ser: 1.15 mg/dL (ref 0.61–1.24)
GFR, Estimated: >60 mL/min (ref >60)
Glucose, Bld: 120 mg/dL — ABNORMAL HIGH (ref 70–99)
Potassium: 4 mmol/L (ref 3.5–5.1)
Sodium: 135 mmol/L (ref 135–145)
Total Bilirubin: 0.7 mg/dL (ref 0.3–1.2)
Total Protein: 7.6 g/dL (ref 6.5–8.1)

## 2021-04-10 LAB — TYPE AND SCREEN
ABO/RH(D): A POS
Antibody Screen: NEGATIVE

## 2021-04-10 LAB — SURGICAL PCR SCREEN
MRSA, PCR: NEGATIVE
Staphylococcus aureus: POSITIVE — AB

## 2021-04-10 NOTE — Patient Instructions (Addendum)
Your procedure is scheduled on:04-20-21 THURSDAY Report to the Registration Desk on the 1st floor of the Medical Mall-Then proceed to the 2nd floor Surgery Desk in the Medical Mall To find out your arrival time, please call [phone] between 1PM - 3PM on:04-19-21 WEDNESDAY  REMEMBER: Instructions that are not followed completely may result in serious medical risk, up to and including death; or upon the discretion of your surgeon and anesthesiologist your surgery may need to be rescheduled.  Do not eat food after midnight the night before surgery.  No gum chewing, lozengers or hard candies.  You may however, drink CLEAR liquids up to 2 hours before you are scheduled to arrive for your surgery. Do not drink anything within 2 hours of your scheduled arrival time.  Clear liquids include: - water  - apple juice without pulp - gatorade - black coffee or tea (Do NOT add milk or creamers to the coffee or tea) Do NOT drink anything that is not on this list.  In addition, your doctor has ordered for you to drink the provided  Ensure Pre-Surgery Clear Carbohydrate Drink Drinking this carbohydrate drink up to two hours before surgery helps to reduce insulin resistance and improve patient outcomes. Please complete drinking 2 hours prior to scheduled arrival time.  TAKE THESE MEDICATIONS THE MORNING OF SURGERY WITH A SIP OF WATER: -PRILOSEC (OMEPRAZOLE)-take one the night before and one on the morning of surgery - helps to prevent nausea after surgery.)  One week prior to surgery: Stop Anti-inflammatories (NSAIDS) such as Advil, Aleve, Ibuprofen, Motrin, Naproxen, Naprosyn and Aspirin based products such as Excedrin, Goodys Powder, BC Powder-OK TO TAKE TYLENOL IF NEEDED   Stop ANY OVER THE COUNTER supplements/vitamins until after surgery.  No Alcohol for 24 hours before or after surgery.  No Smoking including e-cigarettes for 24 hours prior to surgery.  No chewable tobacco products for at  least 6 hours prior to surgery.  No nicotine patches on the day of surgery.  Do not use any "recreational" drugs for at least a week prior to your surgery.  Please be advised that the combination of cocaine and anesthesia may have negative outcomes, up to and including death. If you test positive for cocaine, your surgery will be cancelled.  On the morning of surgery brush your teeth with toothpaste and water, you may rinse your mouth with mouthwash if you wish. Do not swallow any toothpaste or mouthwash.  Do not wear jewelry, make-up, hairpins, clips or nail polish.  Do not wear lotions, powders, or perfumes.   Do not shave body from the neck down 48 hours prior to surgery just in case you cut yourself which could leave a site for infection.  Also, freshly shaved skin may become irritated if using the CHG soap.  Contact lenses, hearing aids and dentures may not be worn into surgery.  Do not bring valuables to the hospital. [facility] is not responsible for any missing/lost belongings or valuables.   Use CHG Soap as directed on instruction sheet  Notify your doctor if there is any change in your medical condition (cold, fever, infection).  Wear comfortable clothing (specific to your surgery type) to the hospital.  Plan for stool softeners for home use; pain medications have a tendency to cause constipation. You can also help prevent constipation by eating foods high in fiber such as fruits and vegetables and drinking plenty of fluids as your diet allows.  After surgery, you can help prevent lung complications by  doing breathing exercises.  Take deep breaths and cough every 1-2 hours. Your doctor may order a device called an Incentive Spirometer to help you take deep breaths. When coughing or sneezing, hold a pillow firmly against your incision with both hands. This is called "splinting." Doing this helps protect your incision. It also decreases belly discomfort.  If you are being  admitted to the hospital overnight, leave your suitcase in the car. After surgery it may be brought to your room.  If you are being discharged the day of surgery, you will not be allowed to drive home. You will need a responsible adult (18 years or older) to drive you home and stay with you that night.   If you are taking public transportation, you will need to have a responsible adult (18 years or older) with you. Please confirm with your physician that it is acceptable to use public transportation.   Please call the Pre-admissions Testing Dept. at [phone] if you have any questions about these instructions.  Surgery Visitation Policy:  Patients undergoing a surgery or procedure may have one family member or support person with them as long as that person is not COVID-19 positive or experiencing its symptoms.  That person may remain in the waiting area during the procedure.  Inpatient Visitation:    Visiting hours are 7 a.m. to 8 p.m. Inpatients will be allowed two visitors daily. The visitors may change each day during the patient's stay. No visitors under the age of 1. Any visitor under the age of 38 must be accompanied by an adult. The visitor must pass COVID-19 screenings, use hand sanitizer when entering and exiting the patient's room and wear a mask at all times, including in the patient's room. Patients must also wear a mask when staff or their visitor are in the room. Masking is required regardless of vaccination status.

## 2021-04-18 ENCOUNTER — Other Ambulatory Visit
Admission: RE | Admit: 2021-04-18 | Discharge: 2021-04-18 | Disposition: A | Discharge status: Home / Self Care | Payer: 59 | Source: Ambulatory Visit | Attending: Orthopedic Surgery | Admitting: Orthopedic Surgery

## 2021-04-18 ENCOUNTER — Other Ambulatory Visit: Payer: Self-pay

## 2021-04-18 DIAGNOSIS — Z20822 Contact with and (suspected) exposure to covid-19: Secondary | ICD-10-CM | POA: Insufficient documentation

## 2021-04-18 DIAGNOSIS — Z01812 Encounter for preprocedural laboratory examination: Secondary | ICD-10-CM | POA: Insufficient documentation

## 2021-04-18 LAB — SARS CORONAVIRUS 2 (TAT 6-24 HRS): SARS Coronavirus 2: NEGATIVE

## 2021-04-20 ENCOUNTER — Inpatient Hospital Stay: Payer: 59

## 2021-04-20 ENCOUNTER — Inpatient Hospital Stay: Payer: 59 | Admitting: Certified Registered"

## 2021-04-20 ENCOUNTER — Inpatient Hospital Stay
Admission: RE | Admit: 2021-04-20 | Discharge: 2021-04-21 | DRG: 470 | Disposition: A | Discharge status: Home Health | Payer: 59 | Attending: Orthopedic Surgery | Admitting: Orthopedic Surgery

## 2021-04-20 ENCOUNTER — Encounter
Admission: RE | Disposition: A | Discharge status: Home / Self Care | Payer: Self-pay | Source: Home / Self Care | Attending: Orthopedic Surgery

## 2021-04-20 ENCOUNTER — Other Ambulatory Visit: Payer: Self-pay

## 2021-04-20 ENCOUNTER — Encounter: Payer: Self-pay | Admitting: Orthopedic Surgery

## 2021-04-20 DIAGNOSIS — Z20822 Contact with and (suspected) exposure to covid-19: Secondary | ICD-10-CM | POA: Diagnosis present

## 2021-04-20 DIAGNOSIS — Z96649 Presence of unspecified artificial hip joint: Secondary | ICD-10-CM

## 2021-04-20 DIAGNOSIS — I10 Essential (primary) hypertension: Secondary | ICD-10-CM | POA: Diagnosis present

## 2021-04-20 DIAGNOSIS — M1611 Unilateral primary osteoarthritis, right hip: Secondary | ICD-10-CM | POA: Diagnosis present

## 2021-04-20 DIAGNOSIS — G8918 Other acute postprocedural pain: Secondary | ICD-10-CM

## 2021-04-20 DIAGNOSIS — Z8249 Family history of ischemic heart disease and other diseases of the circulatory system: Secondary | ICD-10-CM | POA: Diagnosis not present

## 2021-04-20 DIAGNOSIS — Z79899 Other long term (current) drug therapy: Secondary | ICD-10-CM

## 2021-04-20 DIAGNOSIS — Z87891 Personal history of nicotine dependence: Secondary | ICD-10-CM | POA: Diagnosis not present

## 2021-04-20 DIAGNOSIS — Z419 Encounter for procedure for purposes other than remedying health state, unspecified: Secondary | ICD-10-CM

## 2021-04-20 DIAGNOSIS — K219 Gastro-esophageal reflux disease without esophagitis: Secondary | ICD-10-CM | POA: Diagnosis present

## 2021-04-20 DIAGNOSIS — E782 Mixed hyperlipidemia: Secondary | ICD-10-CM | POA: Diagnosis present

## 2021-04-20 DIAGNOSIS — Z8614 Personal history of Methicillin resistant Staphylococcus aureus infection: Secondary | ICD-10-CM

## 2021-04-20 HISTORY — PX: TOTAL HIP ARTHROPLASTY: SHX124

## 2021-04-20 LAB — CBC
HCT: 40.6 % (ref 39.0–52.0)
Hemoglobin: 14.2 g/dL (ref 13.0–17.0)
MCH: 31.2 pg (ref 26.0–34.0)
MCHC: 35 g/dL (ref 30.0–36.0)
MCV: 89.2 fL (ref 80.0–100.0)
Platelets: 253 10*3/uL (ref 150–400)
RBC: 4.55 MIL/uL (ref 4.22–5.81)
RDW: 12.2 % (ref 11.5–15.5)
WBC: 14.9 10*3/uL — ABNORMAL HIGH (ref 4.0–10.5)
nRBC: 0 % (ref 0.0–0.2)

## 2021-04-20 LAB — CREATININE, SERUM
Creatinine, Ser: 0.85 mg/dL (ref 0.61–1.24)
GFR, Estimated: >60 mL/min (ref >60)

## 2021-04-20 SURGERY — ARTHROPLASTY, HIP, TOTAL, ANTERIOR APPROACH
Anesthesia: Spinal | Site: Hip | Laterality: Right

## 2021-04-20 MED ORDER — ENOXAPARIN SODIUM 40 MG/0.4ML IJ SOSY
40.0000 mg | PREFILLED_SYRINGE | INTRAMUSCULAR | Status: DC
  Administered 2021-04-21: 40 mg via SUBCUTANEOUS
  Filled 2021-04-20: qty 0.4

## 2021-04-20 MED ORDER — OXYCODONE HCL 5 MG PO TABS
10.0000 mg | ORAL_TABLET | ORAL | Status: DC | PRN
  Administered 2021-04-20: 15 mg via ORAL
  Filled 2021-04-20: qty 3

## 2021-04-20 MED ORDER — BUPIVACAINE HCL (PF) 0.5 % IJ SOLN
INTRAMUSCULAR | Status: DC | PRN
  Administered 2021-04-20: 3 mL

## 2021-04-20 MED ORDER — CHLORHEXIDINE GLUCONATE 0.12 % MT SOLN
OROMUCOSAL | Status: AC
  Filled 2021-04-20: qty 15

## 2021-04-20 MED ORDER — OXYCODONE HCL 5 MG PO TABS
5.0000 mg | ORAL_TABLET | ORAL | Status: DC | PRN
  Administered 2021-04-20: 10 mg via ORAL
  Filled 2021-04-20: qty 2

## 2021-04-20 MED ORDER — MAGNESIUM CITRATE PO SOLN
1.0000 | Freq: Once | ORAL | Status: DC | PRN
  Filled 2021-04-20: qty 296

## 2021-04-20 MED ORDER — METOCLOPRAMIDE HCL 5 MG/ML IJ SOLN: 5.0000 mg | Freq: Three times a day (TID) | INTRAMUSCULAR | Status: DC | PRN

## 2021-04-20 MED ORDER — SODIUM CHLORIDE 0.9 % IV SOLN: INTRAVENOUS | Status: DC

## 2021-04-20 MED ORDER — PANTOPRAZOLE SODIUM 40 MG PO TBEC
40.0000 mg | DELAYED_RELEASE_TABLET | Freq: Every day | ORAL | Status: DC
  Administered 2021-04-21: 40 mg via ORAL
  Filled 2021-04-20: qty 1

## 2021-04-20 MED ORDER — DEXMEDETOMIDINE (PRECEDEX) IN NS 20 MCG/5ML (4 MCG/ML) IV SYRINGE
PREFILLED_SYRINGE | INTRAVENOUS | Status: DC | PRN
  Administered 2021-04-20: 8 ug via INTRAVENOUS
  Administered 2021-04-20: 4 ug via INTRAVENOUS
  Administered 2021-04-20: 8 ug via INTRAVENOUS

## 2021-04-20 MED ORDER — SODIUM CHLORIDE 0.9 % IV SOLN
INTRAVENOUS | Status: DC | PRN
  Administered 2021-04-20: 20 ug/min via INTRAVENOUS

## 2021-04-20 MED ORDER — LIDOCAINE HCL URETHRAL/MUCOSAL 2 % EX GEL
CUTANEOUS | Status: AC
  Filled 2021-04-20: qty 5

## 2021-04-20 MED ORDER — BUPIVACAINE-EPINEPHRINE 0.25% -1:200000 IJ SOLN
INTRAMUSCULAR | Status: DC | PRN
  Administered 2021-04-20: 30 mL

## 2021-04-20 MED ORDER — ALUM & MAG HYDROXIDE-SIMETH 200-200-20 MG/5ML PO SUSP: 30.0000 mL | ORAL | Status: DC | PRN

## 2021-04-20 MED ORDER — MENTHOL 3 MG MT LOZG
1.0000 | LOZENGE | OROMUCOSAL | Status: DC | PRN
  Filled 2021-04-20: qty 9

## 2021-04-20 MED ORDER — TRAMADOL HCL 50 MG PO TABS
50.0000 mg | ORAL_TABLET | Freq: Four times a day (QID) | ORAL | Status: DC
  Administered 2021-04-20 – 2021-04-21 (×4): 50 mg via ORAL
  Filled 2021-04-20 (×4): qty 1

## 2021-04-20 MED ORDER — ORAL CARE MOUTH RINSE: 15.0000 mL | Freq: Once | OROMUCOSAL | Status: AC

## 2021-04-20 MED ORDER — ACETAMINOPHEN 500 MG PO TABS
1000.0000 mg | ORAL_TABLET | Freq: Four times a day (QID) | ORAL | Status: AC
  Administered 2021-04-20 – 2021-04-21 (×4): 1000 mg via ORAL
  Filled 2021-04-20 (×4): qty 2

## 2021-04-20 MED ORDER — DEXMEDETOMIDINE (PRECEDEX) IN NS 20 MCG/5ML (4 MCG/ML) IV SYRINGE
PREFILLED_SYRINGE | INTRAVENOUS | Status: AC
  Filled 2021-04-20: qty 5

## 2021-04-20 MED ORDER — PHENYLEPHRINE HCL (PRESSORS) 10 MG/ML IV SOLN
INTRAVENOUS | Status: DC | PRN
  Administered 2021-04-20: 80 ug via INTRAVENOUS
  Administered 2021-04-20 (×4): 160 ug via INTRAVENOUS
  Administered 2021-04-20: 80 ug via INTRAVENOUS

## 2021-04-20 MED ORDER — MIDAZOLAM HCL 2 MG/2ML IJ SOLN
INTRAMUSCULAR | Status: AC
  Filled 2021-04-20: qty 2

## 2021-04-20 MED ORDER — FENTANYL CITRATE (PF) 100 MCG/2ML IJ SOLN: 25.0000 ug | INTRAMUSCULAR | Status: DC | PRN

## 2021-04-20 MED ORDER — OLMESARTAN MEDOXOMIL-HCTZ 40-12.5 MG PO TABS: 1.0000 | ORAL_TABLET | Freq: Every evening | ORAL | Status: DC

## 2021-04-20 MED ORDER — BISACODYL 10 MG RE SUPP
10.0000 mg | Freq: Every day | RECTAL | Status: DC | PRN
Start: 2021-04-20 — End: 2021-04-21
  Filled 2021-04-20: qty 1

## 2021-04-20 MED ORDER — HYDROMORPHONE HCL 1 MG/ML IJ SOLN: 0.5000 mg | INTRAMUSCULAR | Status: DC | PRN

## 2021-04-20 MED ORDER — DEXAMETHASONE SODIUM PHOSPHATE 10 MG/ML IJ SOLN
INTRAMUSCULAR | Status: AC
  Filled 2021-04-20: qty 1

## 2021-04-20 MED ORDER — ACETAMINOPHEN 325 MG PO TABS
325.0000 mg | ORAL_TABLET | Freq: Four times a day (QID) | ORAL | Status: DC | PRN
Start: 2021-04-21 — End: 2021-04-21

## 2021-04-20 MED ORDER — ONDANSETRON HCL 4 MG/2ML IJ SOLN
INTRAMUSCULAR | Status: AC
  Filled 2021-04-20: qty 2

## 2021-04-20 MED ORDER — EPHEDRINE 5 MG/ML INJ
INTRAVENOUS | Status: AC
  Filled 2021-04-20: qty 10

## 2021-04-20 MED ORDER — ONDANSETRON HCL 4 MG PO TABS: 4.0000 mg | ORAL_TABLET | Freq: Four times a day (QID) | ORAL | Status: DC | PRN

## 2021-04-20 MED ORDER — LACTATED RINGERS IV SOLN: INTRAVENOUS | Status: DC

## 2021-04-20 MED ORDER — PROPOFOL 1000 MG/100ML IV EMUL
INTRAVENOUS | Status: AC
  Filled 2021-04-20: qty 100

## 2021-04-20 MED ORDER — CEFAZOLIN SODIUM-DEXTROSE 2-4 GM/100ML-% IV SOLN
2.0000 g | INTRAVENOUS | Status: AC
  Administered 2021-04-20: 2 g via INTRAVENOUS

## 2021-04-20 MED ORDER — PROPOFOL 500 MG/50ML IV EMUL
INTRAVENOUS | Status: DC | PRN
  Administered 2021-04-20: 150 ug/kg/min via INTRAVENOUS

## 2021-04-20 MED ORDER — METHOCARBAMOL 1000 MG/10ML IJ SOLN
500.0000 mg | Freq: Four times a day (QID) | INTRAVENOUS | Status: DC | PRN
  Filled 2021-04-20: qty 5

## 2021-04-20 MED ORDER — HYDROCHLOROTHIAZIDE 12.5 MG PO CAPS: 12.5000 mg | ORAL_CAPSULE | Freq: Every evening | ORAL | Status: DC

## 2021-04-20 MED ORDER — DOCUSATE SODIUM 100 MG PO CAPS
100.0000 mg | ORAL_CAPSULE | Freq: Two times a day (BID) | ORAL | Status: DC
  Administered 2021-04-20 – 2021-04-21 (×2): 100 mg via ORAL
  Filled 2021-04-20 (×2): qty 1

## 2021-04-20 MED ORDER — POLYETHYLENE GLYCOL 3350 17 G PO PACK
17.0000 g | PACK | Freq: Every day | ORAL | Status: DC | PRN
  Filled 2021-04-20: qty 1

## 2021-04-20 MED ORDER — PHENOL 1.4 % MT LIQD
1.0000 | OROMUCOSAL | Status: DC | PRN
  Filled 2021-04-20: qty 177

## 2021-04-20 MED ORDER — METOCLOPRAMIDE HCL 10 MG PO TABS
5.0000 mg | ORAL_TABLET | Freq: Three times a day (TID) | ORAL | Status: DC | PRN
Start: 2021-04-20 — End: 2021-04-21

## 2021-04-20 MED ORDER — CHLORHEXIDINE GLUCONATE 0.12 % MT SOLN
15.0000 mL | Freq: Once | OROMUCOSAL | Status: AC
  Administered 2021-04-20: 15 mL via OROMUCOSAL

## 2021-04-20 MED ORDER — ONDANSETRON HCL 4 MG/2ML IJ SOLN
INTRAMUSCULAR | Status: DC | PRN
  Administered 2021-04-20: 4 mg via INTRAVENOUS

## 2021-04-20 MED ORDER — ROSUVASTATIN CALCIUM 10 MG PO TABS
10.0000 mg | ORAL_TABLET | Freq: Every evening | ORAL | Status: DC
  Administered 2021-04-20: 10 mg via ORAL
  Filled 2021-04-20: qty 1

## 2021-04-20 MED ORDER — PROPOFOL 10 MG/ML IV BOLUS
INTRAVENOUS | Status: AC
  Filled 2021-04-20: qty 20

## 2021-04-20 MED ORDER — MIDAZOLAM HCL 5 MG/5ML IJ SOLN
INTRAMUSCULAR | Status: DC | PRN
  Administered 2021-04-20: 2 mg via INTRAVENOUS

## 2021-04-20 MED ORDER — SODIUM CHLORIDE 0.9 % IV SOLN
INTRAVENOUS | Status: DC | PRN
  Administered 2021-04-20: 60 mL

## 2021-04-20 MED ORDER — EPHEDRINE SULFATE 50 MG/ML IJ SOLN
INTRAMUSCULAR | Status: DC | PRN
  Administered 2021-04-20: 10 mg via INTRAVENOUS
  Administered 2021-04-20 (×2): 5 mg via INTRAVENOUS

## 2021-04-20 MED ORDER — BUPIVACAINE HCL (PF) 0.5 % IJ SOLN
INTRAMUSCULAR | Status: AC
  Filled 2021-04-20: qty 10

## 2021-04-20 MED ORDER — DIPHENHYDRAMINE HCL 12.5 MG/5ML PO ELIX
12.5000 mg | ORAL_SOLUTION | ORAL | Status: DC | PRN
  Filled 2021-04-20: qty 10

## 2021-04-20 MED ORDER — ONDANSETRON HCL 4 MG/2ML IJ SOLN: 4.0000 mg | Freq: Once | INTRAMUSCULAR | Status: DC | PRN

## 2021-04-20 MED ORDER — ZOLPIDEM TARTRATE 5 MG PO TABS: 5.0000 mg | ORAL_TABLET | Freq: Every evening | ORAL | Status: DC | PRN

## 2021-04-20 MED ORDER — METHOCARBAMOL 500 MG PO TABS
500.0000 mg | ORAL_TABLET | Freq: Four times a day (QID) | ORAL | Status: DC | PRN
  Administered 2021-04-20 (×2): 500 mg via ORAL
  Filled 2021-04-20 (×2): qty 1

## 2021-04-20 MED ORDER — CEFAZOLIN SODIUM-DEXTROSE 2-4 GM/100ML-% IV SOLN
2.0000 g | Freq: Four times a day (QID) | INTRAVENOUS | Status: AC
  Administered 2021-04-20 (×2): 2 g via INTRAVENOUS
  Filled 2021-04-20 (×2): qty 100

## 2021-04-20 MED ORDER — IRBESARTAN 150 MG PO TABS: 300.0000 mg | ORAL_TABLET | Freq: Every evening | ORAL | Status: DC

## 2021-04-20 MED ORDER — DEXAMETHASONE SODIUM PHOSPHATE 10 MG/ML IJ SOLN
INTRAMUSCULAR | Status: DC | PRN
  Administered 2021-04-20: 5 mg via INTRAVENOUS

## 2021-04-20 MED ORDER — PHENYLEPHRINE HCL (PRESSORS) 10 MG/ML IV SOLN
INTRAVENOUS | Status: AC
  Filled 2021-04-20: qty 1

## 2021-04-20 MED ORDER — CEFAZOLIN SODIUM-DEXTROSE 2-4 GM/100ML-% IV SOLN
INTRAVENOUS | Status: AC
  Filled 2021-04-20: qty 100

## 2021-04-20 MED ORDER — ONDANSETRON HCL 4 MG/2ML IJ SOLN: 4.0000 mg | Freq: Four times a day (QID) | INTRAMUSCULAR | Status: DC | PRN

## 2021-04-20 SURGICAL SUPPLY — 62 items
BLADE SAGITTAL AGGR TOOTH XLG (BLADE) ×3 IMPLANT
BNDG COHESIVE 6X5 TAN STRL LF (GAUZE/BANDAGES/DRESSINGS) ×9 IMPLANT
CANISTER SUCT 1200ML W/VALVE (MISCELLANEOUS) ×3 IMPLANT
CANISTER WOUND CARE 500ML ATS (WOUND CARE) ×3 IMPLANT
CHLORAPREP W/TINT 26 (MISCELLANEOUS) ×3 IMPLANT
COVER BACK TABLE REUSABLE LG (DRAPES) ×3 IMPLANT
COVER WAND RF STERILE (DRAPES) ×3 IMPLANT
CUP ACETAB VERSA DBL 28X58 DMI (Orthopedic Implant) ×3 IMPLANT
DRAPE 3/4 80X56 (DRAPES) ×9 IMPLANT
DRAPE C-ARM XRAY 36X54 (DRAPES) ×3 IMPLANT
DRAPE INCISE IOBAN 66X60 STRL (DRAPES) IMPLANT
DRAPE POUCH INSTRU U-SHP 10X18 (DRAPES) ×3 IMPLANT
DRESSING SURGICEL FIBRLLR 1X2 (HEMOSTASIS) ×4 IMPLANT
DRSG MEPILEX SACRM 8.7X9.8 (GAUZE/BANDAGES/DRESSINGS) ×3 IMPLANT
DRSG OPSITE POSTOP 4X8 (GAUZE/BANDAGES/DRESSINGS) ×6 IMPLANT
DRSG SURGICEL FIBRILLAR 1X2 (HEMOSTASIS) ×6
ELECT BLADE 6.5 EXT (BLADE) ×3 IMPLANT
ELECT REM PT RETURN 9FT ADLT (ELECTROSURGICAL) ×3
ELECTRODE REM PT RTRN 9FT ADLT (ELECTROSURGICAL) ×2 IMPLANT
GLOVE SURG SYN 9.0  PF PI (GLOVE) ×2
GLOVE SURG SYN 9.0 PF PI (GLOVE) ×4 IMPLANT
GLOVE SURG UNDER POLY LF SZ9 (GLOVE) ×3 IMPLANT
GOWN SRG 2XL LVL 4 RGLN SLV (GOWNS) ×2 IMPLANT
GOWN STRL NON-REIN 2XL LVL4 (GOWNS) ×1
GOWN STRL REUS W/ TWL LRG LVL3 (GOWN DISPOSABLE) ×2 IMPLANT
GOWN STRL REUS W/TWL LRG LVL3 (GOWN DISPOSABLE) ×1
HEAD FEMORAL 28MM SZ S (Head) ×3 IMPLANT
HEMOVAC 400CC 10FR (MISCELLANEOUS) IMPLANT
HOLDER FOLEY CATH W/STRAP (MISCELLANEOUS) ×3 IMPLANT
HOOD PEEL AWAY FLYTE STAYCOOL (MISCELLANEOUS) ×3 IMPLANT
IRRIGATION SURGIPHOR STRL (IV SOLUTION) IMPLANT
KIT PREVENA INCISION MGT 13 (CANNISTER) ×3 IMPLANT
MANIFOLD NEPTUNE II (INSTRUMENTS) ×3 IMPLANT
MASTERLOC HIP LATERAL S8 (Hips) ×3 IMPLANT
MAT ABSORB  FLUID 56X50 GRAY (MISCELLANEOUS) ×1
MAT ABSORB FLUID 56X50 GRAY (MISCELLANEOUS) ×2 IMPLANT
NDL SAFETY ECLIPSE 18X1.5 (NEEDLE) ×2 IMPLANT
NEEDLE HYPO 18GX1.5 SHARP (NEEDLE) ×1
NEEDLE SPNL 20GX3.5 QUINCKE YW (NEEDLE) ×6 IMPLANT
NS IRRIG 1000ML POUR BTL (IV SOLUTION) ×3 IMPLANT
PACK HIP COMPR (MISCELLANEOUS) ×3 IMPLANT
SCALPEL PROTECTED #10 DISP (BLADE) ×6 IMPLANT
SEALER BIPOLAR AQUA 6.0 (INSTRUMENTS) ×3 IMPLANT
SHELL ACETABULAR SZ0 58MM (Shell) ×3 IMPLANT
SOL PREP PVP 2OZ (MISCELLANEOUS) ×3
SOLUTION PREP PVP 2OZ (MISCELLANEOUS) ×2 IMPLANT
SPONGE DRAIN TRACH 4X4 STRL 2S (GAUZE/BANDAGES/DRESSINGS) ×3 IMPLANT
STAPLER SKIN PROX 35W (STAPLE) ×3 IMPLANT
STRAP SAFETY 5IN WIDE (MISCELLANEOUS) ×3 IMPLANT
SUT DVC 2 QUILL PDO  T11 36X36 (SUTURE) ×1
SUT DVC 2 QUILL PDO T11 36X36 (SUTURE) ×2 IMPLANT
SUT SILK 0 (SUTURE) ×1
SUT SILK 0 30XBRD TIE 6 (SUTURE) ×2 IMPLANT
SUT V-LOC 90 ABS DVC 3-0 CL (SUTURE) ×3 IMPLANT
SUT VIC AB 1 CT1 36 (SUTURE) ×3 IMPLANT
SYR 20ML LL LF (SYRINGE) ×3 IMPLANT
SYR 30ML LL (SYRINGE) ×3 IMPLANT
SYR 50ML LL SCALE MARK (SYRINGE) ×6 IMPLANT
SYR BULB IRRIG 60ML STRL (SYRINGE) ×3 IMPLANT
TAPE MICROFOAM 4IN (TAPE) ×3 IMPLANT
TOWEL OR 17X26 4PK STRL BLUE (TOWEL DISPOSABLE) ×3 IMPLANT
TRAY FOLEY MTR SLVR 16FR STAT (SET/KITS/TRAYS/PACK) ×3 IMPLANT

## 2021-04-20 NOTE — Plan of Care (Signed)

## 2021-04-20 NOTE — H&P (Signed)
Chief Complaint  Patient presents with  . Pre-op Exam  Right total hip arthroplasty   Arthur Willis is a 60 y.o. male who presents today for history and physical for right total hip arthroplasty with Dr. Kennedy Bucker on 04/20/2021. He has severe right hip osteoarthritis. Has underwent left total hip arthroplasty 2018 and done very well. In 2018 he had advanced right hip osteoarthritis but was not having pain. Over the last few years patient's pain has progressed. Pain is interfering with his activities of daily living. He is unable to walk more than 10 to 15 minutes at a time without having to sit. He has severe pain in the groin, buttocks lateral hip and thigh. No numbness tingling or back pain. He takes over-the-counter anti-inflammatory medications with some relief. Pain can be severe at nighttime. He has a hard time getting in and out of low vehicles, bending over to pick things up off the floor and going up steps.  Past Medical History: Past Medical History:  Diagnosis Date  . B12 deficiency 08/21/2017  188, 9/18  . Benign essential hypertension 07/23/2018  . Hyperlipidemia, mixed 10/28/2018   Past Surgical History: Past Surgical History:  Procedure Laterality Date  . CATARACT EXTRACTION Bilateral 06/2018, 07/2018  . COLONOSCOPY 12/26/2018  normal colon/Repeat 26yrs/TKT  . KNEE ARTHROSCOPY Right  Age 15  . Total hip arthroplasty anterior approach Left 07/23/2017  Dr.Rosalia Mcavoy   Past Family History: Family History  Problem Relation Age of Onset  . High blood pressure (Hypertension) Mother  . Ulcerative colitis Father  . No Known Problems Other  . No Known Problems Son  . Colon cancer Neg Hx  . Colon polyps Neg Hx   Medications: Current Outpatient Medications Ordered in Epic  Medication Sig Dispense Refill  . olmesartan-hydrochlorothiazide (BENICAR HCT) 40-12.5 mg tablet Take 1 tablet by mouth once daily 90 tablet 3  . omeprazole (PRILOSEC) 20 MG DR capsule Take 20 mg by mouth once  daily.  . rosuvastatin (CRESTOR) 10 MG tablet Take 1 tablet (10 mg total) by mouth once daily 90 tablet 3   No current Epic-ordered facility-administered medications on file.   Allergies: No Known Allergies   Review of Systems:  A comprehensive 14 point ROS was performed, reviewed by me today, and the pertinent orthopaedic findings are documented in the HPI.  Exam: BP (!) 184/106  Wt (!) 118.8 kg (262 lb)  BMI 35.53 kg/m  General:  Well developed, well nourished, no apparent distress, normal affect, antalgic gait with no assistive devices  HEENT: Head normocephalic, atraumatic, PERRL.   Abdomen: Soft, non tender, non distended, Bowel sounds present.  Heart: Examination of the heart reveals regular, rate, and rhythm. There is no murmur noted on ascultation. There is a normal apical pulse.  Lungs: Lungs are clear to auscultation. There is no wheeze, rhonchi, or crackles. There is normal expansion of bilateral chest walls.   Right hip: Examination of the right hip shows no tenderness over the trochanteric bursa. No swelling or edema. No warmth redness or bruising. He has 5 degrees of hip internal rotation which causes moderate to severe pain in the groin buttocks and anterior thigh. Neuro vas intact right lower extremity  AP pelvis and lateral view of the right hip reviewed by me today from March 2022. Impression: Patient has advanced right hip osteoarthritis with complete loss of joint space in the superior acetabulum and central joint space. Severe spurring and subchondral changes throughout the acetabulum with large inferior acetabular spur.  There is deformity to the femoral head with mild subchondral cyst formation.  Impression: Primary osteoarthritis of right hip [M16.11] Primary osteoarthritis of right hip (primary encounter diagnosis)  Plan:  47. 60 year old male with severe debilitating right hip pain with advanced right hip osteoarthritis. Pain is interfering with  quality of life and activities day living. Risks, benefits, complications of a right total hip arthroplasty have been discussed with the patient. Patient has agreed and consented procedure with Dr. Kennedy Bucker on 04/20/2021.  This note was generated in part with voice recognition software and I apologize for any typographical errors that were not detected and corrected.  Patience Musca MPA-C  Electronically signed by Patience Musca, PA at 04/18/2021 4:04 PM EDT   Reviewed  H+P. No changes noted.

## 2021-04-20 NOTE — Anesthesia Preprocedure Evaluation (Signed)
Anesthesia Evaluation  Patient identified by MRN, date of birth, ID band Patient awake    Reviewed: Allergy & Precautions, H&P , NPO status , Patient's Chart, lab work & pertinent test results, reviewed documented beta blocker date and time   Airway Mallampati: II   Neck ROM: full    Dental  (+) Poor Dentition   Pulmonary neg pulmonary ROS, former smoker,    Pulmonary exam normal        Cardiovascular hypertension, negative cardio ROS Normal cardiovascular exam Rhythm:regular Rate:Normal     Neuro/Psych negative neurological ROS  negative psych ROS   GI/Hepatic Neg liver ROS, GERD  ,  Endo/Other  negative endocrine ROS  Renal/GU negative Renal ROS  negative genitourinary   Musculoskeletal  (+) Arthritis , Osteoarthritis,    Abdominal   Peds negative pediatric ROS (+)  Hematology negative hematology ROS (+)   Anesthesia Other Findings No past medical history on file. No past surgical history on file.   Reproductive/Obstetrics negative OB ROS                             Anesthesia Physical  Anesthesia Plan  ASA: II  Anesthesia Plan: Spinal   Post-op Pain Management:    Induction:   PONV Risk Score and Plan: 3 and Ondansetron, Dexamethasone, Midazolam and Propofol infusion  Airway Management Planned:   Additional Equipment:   Intra-op Plan:   Post-operative Plan:   Informed Consent: I have reviewed the patients History and Physical, chart, labs and discussed the procedure including the risks, benefits and alternatives for the proposed anesthesia with the patient or authorized representative who has indicated his/her understanding and acceptance.     Dental Advisory Given  Plan Discussed with: CRNA  Anesthesia Plan Comments:         Anesthesia Quick Evaluation

## 2021-04-20 NOTE — Op Note (Signed)
04/20/2021  11:13 AM  PATIENT:  Arthur Willis  60 y.o. male  PRE-OPERATIVE DIAGNOSIS:  Primary osteoarthritis of right hip M16.11  POST-OPERATIVE DIAGNOSIS:  Primary osteoarthritis of right hip M16.11  PROCEDURE:  Procedure(s): TOTAL HIP ARTHROPLASTY ANTERIOR APPROACH (Right) APPLICATION OF CELL SAVER (N/A)  SURGEON: Leitha Schuller, MD  ASSISTANTS: None  ANESTHESIA:   spinal  EBL:  Total I/O In: 1000 [I.V.:1000] Out: 300 [Urine:150; Blood:150]  BLOOD ADMINISTERED:none  DRAINS: Incisional wound VAC   LOCAL MEDICATIONS USED:  MARCAINE    and OTHER Exparel  SPECIMEN:  Source of Specimen:  Right femoral head  DISPOSITION OF SPECIMEN:  PATHOLOGY  COUNTS:  YES  TOURNIQUET:  * No tourniquets in log *  IMPLANTS: Medacta Masterloc 8 lateralized stem, 58 mm Mpact DM cup and liner with ceramic S 28 mm head  DICTATION: .Dragon Dictation   The patient was brought to the operating room and after spinal anesthesia was obtained patient was placed on the operative table with the ipsilateral foot into the Medacta attachment, contralateral leg on a well-padded table. C-arm was brought in and preop template x-ray taken. After prepping and draping in usual sterile fashion appropriate patient identification and timeout procedures were completed. Anterior approach to the hip was obtained and centered over the greater trochanter and TFL muscle. The subcutaneous tissue was incised hemostasis being achieved by electrocautery. TFL fascia was incised and the muscle retracted laterally deep retractor placed. The lateral femoral circumflex vessels were identified and ligated. The anterior capsule was exposed and a capsulotomy performed. The neck was identified and a femoral neck cut carried out with a saw. The head was removed without difficulty and showed sclerotic femoral head and acetabulum. Reaming was carried out to 58 mm and a 58 mm cup trial gave appropriate tightness to the acetabular component a 58  DM cup was impacted into position. The leg was then externally rotated and ischiofemoral and pubofemoral releases carried out. The femur was sequentially broached to a size 8, size 8 lateralized with S head trials were placed and the final components chosen. The 8 lateralized stem was inserted along with a ceramic S 28 mm head and 58 mm liner. The hip was reduced and was stable the wound was thoroughly irrigated with fibrillar placed along the posterior capsule and medial neck. The deep fascia ws closed using a heavy Quill after infiltration of 30 cc of quarter percent Sensorcaine with epinephrine diluted with Exparel throughout the case .3-0 V-loc to close the skin with skin staples.  Incisional wound VAC applied and patient was sent to recovery in stable condition.   PLAN OF CARE: Admit for overnight observation

## 2021-04-20 NOTE — Transfer of Care (Signed)
Immediate Anesthesia Transfer of Care Note  Patient: Arthur Willis  Procedure(s) Performed: TOTAL HIP ARTHROPLASTY ANTERIOR APPROACH (Right Hip) APPLICATION OF CELL SAVER (N/A )  Patient Location: PACU  Anesthesia Type:Spinal  Level of Consciousness: awake and alert   Airway & Oxygen Therapy: Patient Spontanous Breathing and Patient connected to face mask oxygen  Post-op Assessment: Report given to RN and Post -op Vital signs reviewed and stable  Post vital signs: Reviewed  Last Vitals:  Vitals Value Taken Time  BP    Temp    Pulse    Resp    SpO2      Last Pain:  Vitals:   04/20/21 0825  PainSc: 4          Complications: No complications documented.

## 2021-04-20 NOTE — Evaluation (Signed)
Physical Therapy Evaluation Patient Details Name: Arthur Willis MRN: [id number] DOB: 1960-12-21 Today's Date: 04/20/2021   History of Present Illness  61 y/o male s/p R total hip replacement (anterior approach), had L hip replaced ~4 years ago.  Clinical Impression  Pt did very well with PT exam and subsequent exercises and gait training.  He showed good LE strength and did well with exercises (AROM and with resistance), he was able to perform mobility and transfers w/o direct assist and did very well with ambulation, easily attaining consistent and confident cadence.  Pt doing very well POD0, minimal pain t/o the session.      Follow Up Recommendations Follow surgeon's recommendation for DC plan and follow-up therapies    Equipment Recommendations  None recommended by PT    Recommendations for Other Services       Precautions / Restrictions Precautions Precautions: Anterior Hip Precaution Booklet Issued: Yes (comment) (HEP) Restrictions Weight Bearing Restrictions: Yes RLE Weight Bearing: Weight bearing as tolerated      Mobility  Bed Mobility Overal bed mobility: Modified Independent             General bed mobility comments: gets to EOB w/o assist    Transfers Overall transfer level: Needs assistance Equipment used: Rolling walker (2 wheeled) Transfers: Sit to/from Stand Sit to Stand: Min guard         General transfer comment: able to rise from standard height bed, though L LE and UE reliant  Ambulation/Gait Ambulation/Gait assistance: Modified independent (Device/Increase time) Gait Distance (Feet): 100 Feet Assistive device: Rolling walker (2 wheeled)       General Gait Details: Pt was able to assume consistent and smooth cadence from the first step.  He did not show any hesitation with R WBing and was not overly reliant on the walker.  HR to ~110, O2 remains in the high 90s.  Stairs            Wheelchair Mobility    Modified Rankin (Stroke  Patients Only)       Balance Overall balance assessment: Modified Independent                                           Pertinent Vitals/Pain Pain Assessment: 0-10 Pain Score: 3  Pain Location: anterior R hip    Home Living Family/patient expects to be discharged to:: Private residence Living Arrangements: Alone Available Help at Discharge: Family;Available 24 hours/day (daughter will be staying 24/7 for a few days, family will provide intermittent assist as needed beyond that)   Home Access: Stairs to enter Entrance Stairs-Rails: Can reach both (rail on R, brick halfwall (?) on R) Entrance Stairs-Number of Steps: 6 Home Layout: One level Home Equipment: Walker - 2 wheels;Bedside commode;Cane - single point      Prior Function Level of Independence: Independent         Comments: Pt reports that he did well with recovery on prior total hip and is eager to do the same this time around     Hand Dominance        Extremity/Trunk Assessment   Upper Extremity Assessment Upper Extremity Assessment: Overall WFL for tasks assessed    Lower Extremity Assessment Lower Extremity Assessment:  (good R LE strength with expected post-op weakness)       Communication   Communication: No difficulties  Cognition Arousal/Alertness: Awake/alert Behavior During  Therapy: WFL for tasks assessed/performed Overall Cognitive Status: Within Functional Limits for tasks assessed                                        General Comments      Exercises Total Joint Exercises Ankle Circles/Pumps: AROM;10 reps Quad Sets: Strengthening;10 reps Short Arc Quad: Strengthening;10 reps Heel Slides: Strengthening;10 reps (with resisted leg extensions) Hip ABduction/ADduction: Strengthening;10 reps   Assessment/Plan    PT Assessment Patient needs continued PT services  PT Problem List Decreased strength;Decreased range of motion;Decreased activity  tolerance;Decreased balance;Decreased mobility;Decreased knowledge of use of DME;Decreased safety awareness;Pain       PT Treatment Interventions DME instruction;Gait training;Stair training;Functional mobility training;Therapeutic activities;Therapeutic exercise;Balance training;Patient/family education    PT Goals (Current goals can be found in the Care Plan section)  Acute Rehab PT Goals Patient Stated Goal: go home and get back to activity PT Goal Formulation: With patient Time For Goal Achievement: 05/04/21 Potential to Achieve Goals: Good    Frequency BID   Barriers to discharge        Co-evaluation               AM-PAC PT "6 Clicks" Mobility  Outcome Measure Help needed turning from your back to your side while in a flat bed without using bedrails?: None Help needed moving from lying on your back to sitting on the side of a flat bed without using bedrails?: None Help needed moving to and from a bed to a chair (including a wheelchair)?: None Help needed standing up from a chair using your arms (e.g., wheelchair or bedside chair)?: None Help needed to walk in hospital room?: None Help needed climbing 3-5 steps with a railing? : A Little 6 Click Score: 23    End of Session Equipment Utilized During Treatment: Gait belt Activity Tolerance: Patient tolerated treatment well Patient left: with chair alarm set;with call bell/phone within reach;with family/visitor present   PT Visit Diagnosis: Muscle weakness (generalized) (M62.81);Difficulty in walking, not elsewhere classified (R26.2);Pain Pain - Right/Left: Right Pain - part of body: Hip    Time: 7616-0737 PT Time Calculation (min) (ACUTE ONLY): 40 min   Charges:   PT Evaluation $PT Eval Low Complexity: 1 Low PT Treatments $Gait Training: 8-22 mins $Therapeutic Exercise: 8-22 mins        Malachi Pro, DPT 04/20/2021, 5:43 PM

## 2021-04-21 ENCOUNTER — Encounter: Payer: Self-pay | Admitting: Orthopedic Surgery

## 2021-04-21 LAB — CBC
HCT: 38.5 % — ABNORMAL LOW (ref 39.0–52.0)
Hemoglobin: 13.4 g/dL (ref 13.0–17.0)
MCH: 31.2 pg (ref 26.0–34.0)
MCHC: 34.8 g/dL (ref 30.0–36.0)
MCV: 89.7 fL (ref 80.0–100.0)
Platelets: 296 10*3/uL (ref 150–400)
RBC: 4.29 MIL/uL (ref 4.22–5.81)
RDW: 12.4 % (ref 11.5–15.5)
WBC: 11.2 10*3/uL — ABNORMAL HIGH (ref 4.0–10.5)
nRBC: 0 % (ref 0.0–0.2)

## 2021-04-21 LAB — BASIC METABOLIC PANEL
Anion gap: 9 (ref 5–15)
BUN: 25 mg/dL — ABNORMAL HIGH (ref 6–20)
CO2: 26 mmol/L (ref 22–32)
Calcium: 8.7 mg/dL — ABNORMAL LOW (ref 8.9–10.3)
Chloride: 100 mmol/L (ref 98–111)
Creatinine, Ser: 1.01 mg/dL (ref 0.61–1.24)
GFR, Estimated: >60 mL/min (ref >60)
Glucose, Bld: 122 mg/dL — ABNORMAL HIGH (ref 70–99)
Potassium: 3.8 mmol/L (ref 3.5–5.1)
Sodium: 135 mmol/L (ref 135–145)

## 2021-04-21 MED ORDER — OXYCODONE HCL 5 MG PO TABS: 5.0000 mg | ORAL_TABLET | ORAL | 0 refills | Status: AC | PRN

## 2021-04-21 MED ORDER — ACETAMINOPHEN 500 MG PO TABS: 500.0000 mg | ORAL_TABLET | Freq: Four times a day (QID) | ORAL | Status: AC | PRN

## 2021-04-21 MED ORDER — DOCUSATE SODIUM 100 MG PO CAPS: 100.0000 mg | ORAL_CAPSULE | Freq: Two times a day (BID) | ORAL | 0 refills | Status: AC

## 2021-04-21 MED ORDER — TRAMADOL HCL 50 MG PO TABS: 50.0000 mg | ORAL_TABLET | Freq: Four times a day (QID) | ORAL | 0 refills | Status: AC | PRN

## 2021-04-21 MED ORDER — METHOCARBAMOL 500 MG PO TABS: 500.0000 mg | ORAL_TABLET | Freq: Four times a day (QID) | ORAL | 0 refills | Status: AC | PRN

## 2021-04-21 MED ORDER — ENOXAPARIN SODIUM 40 MG/0.4ML IJ SOSY: 40.0000 mg | PREFILLED_SYRINGE | INTRAMUSCULAR | 0 refills | Status: AC

## 2021-04-21 NOTE — Discharge Summary (Signed)
Physician Discharge Summary  Patient ID: Arthur Willis MRN: [id number] DOB/AGE: 1961-09-22 60 y.o.  Admit date: 04/20/2021 Discharge date: 04/21/2021  Admission Diagnoses:  S/P hip replacement [Z96.649]   Discharge Diagnoses: Patient Active Problem List   Diagnosis Date Noted  . S/P hip replacement 04/20/2021  . Primary osteoarthritis of left hip 07/23/2017    Past Medical History:  Diagnosis Date  . Arthritis   . GERD (gastroesophageal reflux disease)   . History of methicillin resistant staphylococcus aureus (MRSA) 2020  . Hypertension      Transfusion: none   Consultants (if any):   Discharged Condition: Improved  Hospital Course: Rawn Quiroa is an 60 y.o. male who was admitted 04/20/2021 with a diagnosis of right hip osteoathritis and went to the operating room on 04/20/2021 and underwent the above named procedures.    Surgeries: Procedure(s): TOTAL HIP ARTHROPLASTY ANTERIOR APPROACH APPLICATION OF CELL SAVER on 04/20/2021 Patient tolerated the surgery well. Taken to PACU where she was stabilized and then transferred to the orthopedic floor.  Started on Lovenox 40 mg q 24 hrs. Foot pumps applied bilaterally at 80 mm. Heels elevated on bed with rolled towels. No evidence of DVT. Negative Homan. Physical therapy started on day #1 for gait training and transfer. OT started day #1 for ADL and assisted devices.  Patient's foley was d/c on day #1. Patient's IV was d/c on day #1.  On post op day #1 patient was stable and ready for discharge to home with HHPT.  Implants: Medacta Masterloc 8 lateralized stem, 58 mm Mpact DM cup and liner with ceramic S 28 mm head  He was given perioperative antibiotics:  Anti-infectives (From admission, onward)   Start     Dose/Rate Route Frequency Ordered Stop   04/20/21 1600  ceFAZolin (ANCEF) IVPB 2g/100 mL premix        2 g 200 mL/hr over 30 Minutes Intravenous Every 6 hours 04/20/21 1359 04/20/21 2351   04/20/21 0835  ceFAZolin (ANCEF)  2-4 GM/100ML-% IVPB       Note to Pharmacy: Rayann Heman   : cabinet override      04/20/21 0835 04/20/21 0957   04/20/21 0600  ceFAZolin (ANCEF) IVPB 2g/100 mL premix        2 g 200 mL/hr over 30 Minutes Intravenous On call to O.R. 04/20/21 6063 04/20/21 0956    .  He was given sequential compression devices, early ambulation, and Lovenox TEDs for DVT prophylaxis.  He benefited maximally from the hospital stay and there were no complications.    Recent vital signs:  Vitals:   04/21/21 0746 04/21/21 1114  BP: 126/84 (!) 141/82  Pulse: 93 95  Resp: 16 16  Temp: 98 F (36.7 C) 97.6 F (36.4 C)  SpO2: 94% 94%    Recent laboratory studies:  Lab Results  Component Value Date   HGB 13.4 04/21/2021   HGB 14.2 04/20/2021   HGB 15.0 04/10/2021   Lab Results  Component Value Date   WBC 11.2 (H) 04/21/2021   PLT 296 04/21/2021   Lab Results  Component Value Date   INR 1.01 07/23/2017   Lab Results  Component Value Date   NA 135 04/21/2021   K 3.8 04/21/2021   CL 100 04/21/2021   CO2 26 04/21/2021   BUN 25 (H) 04/21/2021   CREATININE 1.01 04/21/2021   GLUCOSE 122 (H) 04/21/2021    Discharge Medications:   Allergies as of 04/21/2021   No Known Allergies  Medication List    STOP taking these medications   Aspirin-Caffeine 845-65 MG Pack     TAKE these medications   acetaminophen 500 MG tablet Commonly known as: TYLENOL Take 1-2 tablets (500-1,000 mg total) by mouth every 6 (six) hours as needed for mild pain (pain score 1-3 or temp > 100.5).   docusate sodium 100 MG capsule Commonly known as: COLACE Take 1 capsule (100 mg total) by mouth 2 (two) times daily.   enoxaparin 40 MG/0.4ML injection Commonly known as: LOVENOX Inject 0.4 mLs (40 mg total) into the skin daily for 14 days. Start taking on: Apr 22, 2021   methocarbamol 500 MG tablet Commonly known as: ROBAXIN Take 1 tablet (500 mg total) by mouth every 6 (six) hours as needed for muscle  spasms.   olmesartan-hydrochlorothiazide 40-12.5 MG tablet Commonly known as: BENICAR HCT Take 1 tablet by mouth every evening.   omeprazole 20 MG capsule Commonly known as: PRILOSEC Take 20 mg by mouth daily before breakfast.   oxyCODONE 5 MG immediate release tablet Commonly known as: Oxy IR/ROXICODONE Take 1-2 tablets (5-10 mg total) by mouth every 4 (four) hours as needed for moderate pain (pain score 4-6).   rosuvastatin 10 MG tablet Commonly known as: CRESTOR Take 10 mg by mouth every evening.   traMADol 50 MG tablet Commonly known as: ULTRAM Take 1 tablet (50 mg total) by mouth every 6 (six) hours as needed.            Durable Medical Equipment  (From admission, onward)         Start     Ordered   04/20/21 1400  DME Walker rolling  Once       Question Answer Comment  Walker: With 5 Inch Wheels   Patient needs a walker to treat with the following condition S/P hip replacement      04/20/21 1359   04/20/21 1400  DME 3 n 1  Once        04/20/21 1359   04/20/21 1400  DME Bedside commode  Once       Question:  Patient needs a bedside commode to treat with the following condition  Answer:  S/P hip replacement   04/20/21 1359          Diagnostic Studies: DG HIP OPERATIVE UNILAT W OR W/O PELVIS RIGHT  Result Date: 04/20/2021 CLINICAL DATA:  Right hip replacement. EXAM: OPERATIVE RIGHT HIP (WITH PELVIS IF PERFORMED)  VIEWS TECHNIQUE: Fluoroscopic spot image(s) were submitted for interpretation post-operatively. COMPARISON:  None. FINDINGS: Fluoro time: 18 seconds. Six C-arm fluoroscopic images were obtained intraoperatively and submitted for post operative interpretation. The first 2 images demonstrate the right hip prior to surgical intervention. The final 4 images/cines demonstrate sequential changes associated with right total hip arthroplasty without unexpected findings. Please see the performing provider's procedural report for further detail. IMPRESSION: Right  total hip arthroplasty. Electronically Signed   By: Feliberto Harts MD   On: 04/20/2021 12:40   DG HIP UNILAT W OR W/O PELVIS 2-3 VIEWS RIGHT  Result Date: 04/20/2021 CLINICAL DATA:  Status post total hip arthroplasty. EXAM: DG HIP (WITH OR WITHOUT PELVIS) 2-3V RIGHT COMPARISON:  None. FINDINGS: Status post right total hip arthroplasty. No evidence of immediate hardware complication. Expected overlying soft tissue swelling and gas. Skin staples. No dislocation or evidence of acute fracture. IMPRESSION: Right total hip arthroplasty. Electronically Signed   By: Feliberto Harts MD   On: 04/20/2021 12:38    Disposition:  Follow-up Information    Evon Slack, PA-C Follow up in 2 week(s).   Specialties: Orthopedic Surgery, Emergency Medicine Contact information: [address] [place] [address] [phone]                Signed: Patience Musca 04/21/2021, 11:38 AM

## 2021-04-21 NOTE — Anesthesia Postprocedure Evaluation (Signed)
Anesthesia Post Note  Patient: Arthur Willis  Procedure(s) Performed: TOTAL HIP ARTHROPLASTY ANTERIOR APPROACH (Right Hip) APPLICATION OF CELL SAVER (N/A )  Patient location during evaluation: Nursing Unit Anesthesia Type: Spinal Level of consciousness: oriented and awake and alert Pain management: pain level controlled Vital Signs Assessment: post-procedure vital signs reviewed and stable Respiratory status: spontaneous breathing and respiratory function stable Cardiovascular status: blood pressure returned to baseline and stable Postop Assessment: no headache, no backache, no apparent nausea or vomiting and patient able to bend at knees Anesthetic complications: no   No complications documented.   Last Vitals:  Vitals:   04/21/21 0004 04/21/21 0438  BP: 140/84 (!) 131/98  Pulse: 98 100  Resp: 17 18  Temp: 37 C (!) 36.3 C  SpO2: 92% 96%    Last Pain:  Vitals:   04/20/21 1955  PainSc: 4                  Stormy Fabian A

## 2021-04-21 NOTE — Progress Notes (Signed)
Blood pressure (!) 141/82, pulse 95, temperature 97.6 F (36.4 C), temperature source Oral, resp. rate 16, weight 120.3 kg, SpO2 94 %.  IV catheter removed site was clean, dry and intact. Discharge packet discussed with pt an daughter at bedside. Both verbalized understanding. Pt transported via W/C with all belongings and discharge packet to personal car.

## 2021-04-21 NOTE — Progress Notes (Signed)
   Subjective: 1 Day Post-Op Procedure(s) (LRB): TOTAL HIP ARTHROPLASTY ANTERIOR APPROACH (Right) APPLICATION OF CELL SAVER (N/A) Patient reports pain as mild.   Patient is well, and has had no acute complaints or problems Denies any CP, SOB, ABD pain. We will continue therapy today.  Plan is to go Home after hospital stay.  Objective: Vital signs in last 24 hours: Temp:  [97.2 F (36.2 C)-98.6 F (37 C)] 98 F (36.7 C) (05/20 0746) Pulse Rate:  [79-114] 93 (05/20 0746) Resp:  [12-20] 16 (05/20 0746) BP: (89-140)/(59-98) 126/84 (05/20 0746) SpO2:  [92 %-100 %] 94 % (05/20 0746)  Intake/Output from previous day: 05/19 0701 - 05/20 0700 In: 2000 [I.V.:2000] Out: 1150 [Urine:1000; Blood:150] Intake/Output this shift: No intake/output data recorded.  Recent Labs    04/20/21 1553 04/21/21 0526  HGB 14.2 13.4   Recent Labs    04/20/21 1553 04/21/21 0526  WBC 14.9* 11.2*  RBC 4.55 4.29  HCT 40.6 38.5*  PLT 253 296   Recent Labs    04/20/21 1553 04/21/21 0526  NA  --  135  K  --  3.8  CL  --  100  CO2  --  26  BUN  --  25*  CREATININE 0.85 1.01  GLUCOSE  --  122*  CALCIUM  --  8.7*   No results for input(s): LABPT, INR in the last 72 hours.  EXAM General - Patient is Alert, Appropriate and Oriented Extremity - Neurovascular intact Sensation intact distally Intact pulses distally Dorsiflexion/Plantar flexion intact No cellulitis present Compartment soft Dressing - dressing C/D/I and no drainage, prevena intact with out drainage Motor Function - intact, moving foot and toes well on exam.   Past Medical History:  Diagnosis Date  . Arthritis   . GERD (gastroesophageal reflux disease)   . History of methicillin resistant staphylococcus aureus (MRSA) 2020  . Hypertension     Assessment/Plan:   1 Day Post-Op Procedure(s) (LRB): TOTAL HIP ARTHROPLASTY ANTERIOR APPROACH (Right) APPLICATION OF CELL SAVER (N/A) Active Problems:   S/P hip  replacement  Estimated body mass index is 35.97 kg/m as calculated from the following:   Height as of 04/10/21: 6' (1.829 m).   Weight as of this encounter: 120.3 kg. Advance diet Up with therapy  Labs and VSS Pain well controlled Plan on discharge to home with HHPT today  DVT Prophylaxis - Lovenox, Foot Pumps and TED hose Weight-Bearing as tolerated to right leg   T. Cranston Neighbor, PA-C [facility] 04/21/2021, 10:44 AM

## 2021-04-21 NOTE — Evaluation (Signed)
Occupational Therapy Evaluation Patient Details Name: Tryone Kille MRN: [id number] DOB: 01/10/1961 Today's Date: 04/21/2021    History of Present Illness 60 y/o male s/p R total hip replacement (anterior approach), had L hip replaced ~4 years ago.   Clinical Impression   Pt reports being independent at baseline. Pt lives at home with family who is available 24/7 at discharge. Pt ambulating at mod I level with use of RW this admission. OT discussed home set up , self care recommendations for LB ADL independence, and discussed equipment. Pt has all needed equipment at home. Pt does not need skilled OT intervention at this time. OT to SIGN OFF. Pt with no further concerns.     Follow Up Recommendations  No OT follow up    Equipment Recommendations  None recommended by OT       Precautions / Restrictions Precautions Precaution Comments: direct anterior Restrictions Weight Bearing Restrictions: Yes RLE Weight Bearing: Weight bearing as tolerated      Mobility Bed Mobility               General bed mobility comments: seated in recliner chair upon entering the room    Transfers Overall transfer level: Needs assistance Equipment used: Rolling walker (2 wheeled) Transfers: Sit to/from Stand Sit to Stand: Supervision              Balance Overall balance assessment: Modified Independent                 ADL either performed or assessed with clinical judgement   ADL                                         General ADL Comments: independent for UB self care, mod I for functional transfers, min A for shoes and socks.     Vision Patient Visual Report: No change from baseline            Hand Dominance Right   Extremity/Trunk Assessment Upper Extremity Assessment Upper Extremity Assessment: Overall WFL for tasks assessed   Lower Extremity Assessment Lower Extremity Assessment: Defer to PT evaluation       Communication  Communication Communication: No difficulties   Cognition Arousal/Alertness: Awake/alert Behavior During Therapy: WFL for tasks assessed/performed Overall Cognitive Status: Within Functional Limits for tasks assessed                                                Home Living Family/patient expects to be discharged to:: Private residence Living Arrangements: Alone Available Help at Discharge: Family;Available 24 hours/day Type of Home: House Home Access: Stairs to enter [employer] of Steps: 6 Entrance Stairs-Rails: Can reach both Home Layout: One level     Bathroom Shower/Tub: Producer, television/film/video: Standard     Home Equipment: Environmental consultant - 2 wheels;Bedside commode;Cane - single point;Other (comment)   Additional Comments: LH reacher      Prior Functioning/Environment Level of Independence: Independent        Comments: Pt reports that he did well with recovery on prior total hip and is eager to do the same this time around                 OT Goals(Current goals can be found  in the care plan section) Acute Rehab OT Goals Patient Stated Goal: go home and get back to activity OT Goal Formulation: With patient/family Time For Goal Achievement: 05/05/21 Potential to Achieve Goals: Good  OT Frequency:      AM-PAC OT "6 Clicks" Daily Activity     Outcome Measure Help from another person eating meals?: None Help from another person taking care of personal grooming?: None Help from another person toileting, which includes using toliet, bedpan, or urinal?: None Help from another person bathing (including washing, rinsing, drying)?: None Help from another person to put on and taking off regular upper body clothing?: None Help from another person to put on and taking off regular lower body clothing?: A Little 6 Click Score: 23   End of Session Equipment Utilized During Treatment: Rolling walker Nurse Communication: Mobility  status  Activity Tolerance: Patient tolerated treatment well Patient left: with call bell/phone within reach;in chair                   Time: 1005-1025 OT Time Calculation (min): 20 min Charges:  OT General Charges $OT Visit: 1 Visit OT Evaluation $OT Eval Low Complexity: 1 Low OT Treatments $Self Care/Home Management : 8-22 mins  Jackquline Denmark, MS, OTR/L , CBIS ascom [phone]  04/21/21, 12:43 PM

## 2021-04-21 NOTE — Progress Notes (Signed)
Physical Therapy Treatment Patient Details Name: Arthur Willis MRN: [id number] DOB: [birth year] Today's Date: 04/21/2021    History of Present Illness 60 y/o male s/p R total hip replacement (anterior approach), had L hip replaced ~4 years ago.    PT Comments    Pt performed STS with supervision and use of FWW.  Pt has good standing balance and was able to ambulate with smooth pursuit towards the therapy gym to attempt stair training.  Pt given verbal and visual cuing prior to attempt and had good carryover.  Pt attempted to ambulate up the stairs with both rails then attempted a second time to perform with just railing on the R to mimic as closely as possible the stairs he has at home.  Pt performed extremely well and continued to ambulate to his room.  Current discharge plans with following surgeons recommendation remain appropriate at this time.  Pt will continue to benefit from skilled therapy in order to address deficits listed below.    Follow Up Recommendations  Follow surgeon's recommendation for DC plan and follow-up therapies     Equipment Recommendations  None recommended by PT    Recommendations for Other Services       Precautions / Restrictions Precautions Precautions: Anterior Hip Precaution Booklet Issued: Yes (comment) Precaution Comments: direct anterior Restrictions Weight Bearing Restrictions: Yes RLE Weight Bearing: Weight bearing as tolerated    Mobility  Bed Mobility               General bed mobility comments: seated in recliner chair upon entering the room    Transfers Overall transfer level: Needs assistance Equipment used: Rolling walker (2 wheeled) Transfers: Sit to/from Stand Sit to Stand: Supervision            Ambulation/Gait Ambulation/Gait assistance: Modified independent (Device/Increase time) Gait Distance (Feet): 350 Feet Assistive device: Rolling walker (2 wheeled)       General Gait Details: Pt able to ambulate with  smooth and consident steps with no LOB.   Stairs Stairs: Yes Stairs assistance: Modified independent (Device/Increase time) Stair Management: Two rails;Forwards;One rail Right;Sideways;Step to pattern Number of Stairs: 8 General stair comments: Pt able to ambulate up 4 steps with bilateral rails, then performed with only one rail on the R to simulate home stairs which ahas two rails for a portion of the ascension, then only on the R side the last portion.   Wheelchair Mobility    Modified Rankin (Stroke Patients Only)       Balance Overall balance assessment: Modified Independent                                          Cognition Arousal/Alertness: Awake/alert Behavior During Therapy: WFL for tasks assessed/performed Overall Cognitive Status: Within Functional Limits for tasks assessed                                        Exercises      General Comments        Pertinent Vitals/Pain Pain Assessment: No/denies pain    Home Living Family/patient expects to be discharged to:: Private residence Living Arrangements: Alone Available Help at Discharge: Family;Available 24 hours/day Type of Home: House Home Access: Stairs to enter Entrance Stairs-Rails: Can reach both Home Layout: One level Home Equipment: Dan Humphreys -  2 wheels;Bedside commode;Cane - single point;Other (comment) Additional Comments: LH reacher    Prior Function Level of Independence: Independent      Comments: Pt reports that he did well with recovery on prior total hip and is eager to do the same this time around   PT Goals (current goals can now be found in the care plan section) Acute Rehab PT Goals Patient Stated Goal: go home and get back to activity Progress towards PT goals: Progressing toward goals    Frequency    BID      PT Plan      Co-evaluation              AM-PAC PT "6 Clicks" Mobility   Outcome Measure  Help needed turning from your  back to your side while in a flat bed without using bedrails?: None Help needed moving from lying on your back to sitting on the side of a flat bed without using bedrails?: None Help needed moving to and from a bed to a chair (including a wheelchair)?: None Help needed standing up from a chair using your arms (e.g., wheelchair or bedside chair)?: None Help needed to walk in hospital room?: None Help needed climbing 3-5 steps with a railing? : None 6 Click Score: 24    End of Session Equipment Utilized During Treatment: Gait belt Activity Tolerance: Patient tolerated treatment well Patient left: with chair alarm set;with call bell/phone within reach;with family/visitor present   PT Visit Diagnosis: Muscle weakness (generalized) (M62.81);Difficulty in walking, not elsewhere classified (R26.2);Pain Pain - Right/Left: Right Pain - part of body: Hip     Time: 1211-1224 PT Time Calculation (min) (ACUTE ONLY): 13 min  Charges:  $Gait Training: 8-22 mins                     Nolon Bussing, PT, DPT 04/21/21, 1:37 PM    Phineas Real 04/21/2021, 1:35 PM

## 2021-04-21 NOTE — Discharge Instructions (Signed)

## 2021-04-24 LAB — SURGICAL PATHOLOGY

## 2022-05-31 IMAGING — DX DG HIP (WITH OR WITHOUT PELVIS) 2-3V*R*
2 series · 2 of 2 positions shown · non-contrast
Comparison: None.

CLINICAL DATA: Status post total hip arthroplasty.

EXAM:
DG HIP (WITH OR WITHOUT PELVIS) 2-3V RIGHT

[hip ap]
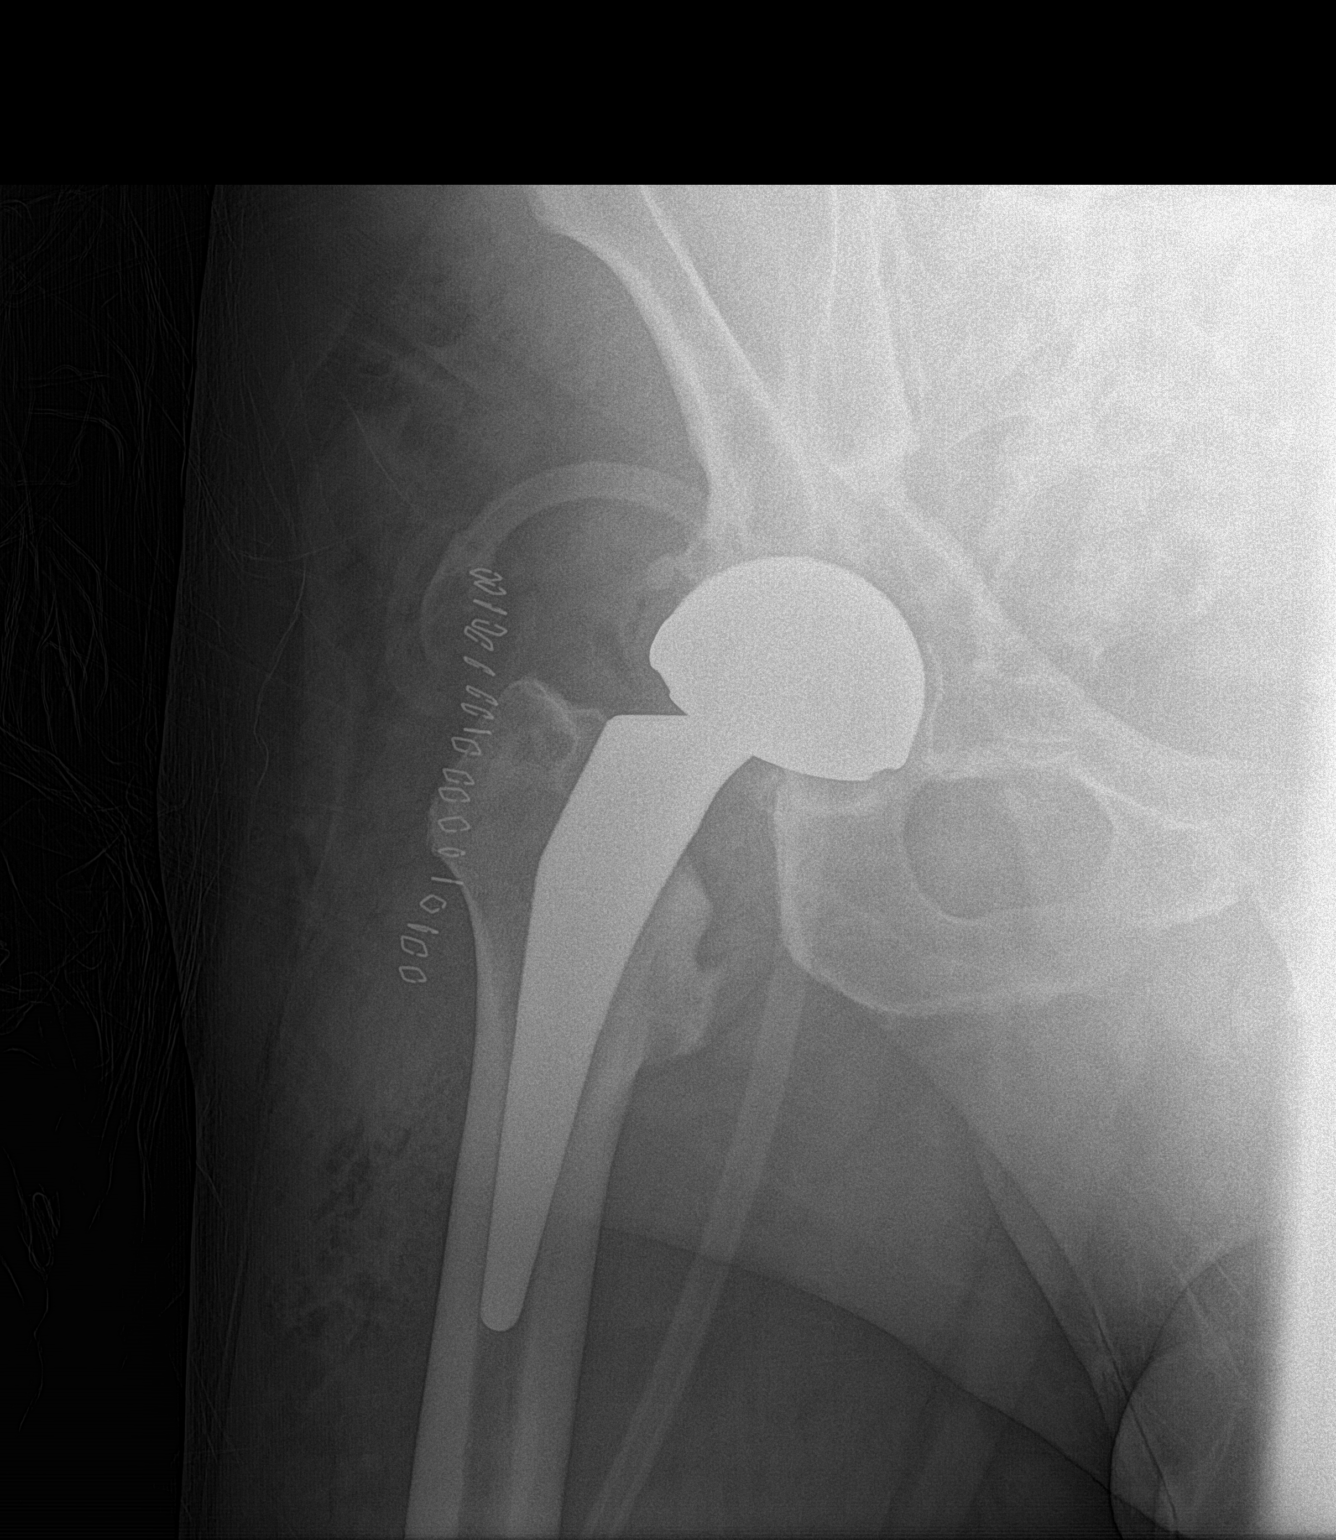

[hip lat]
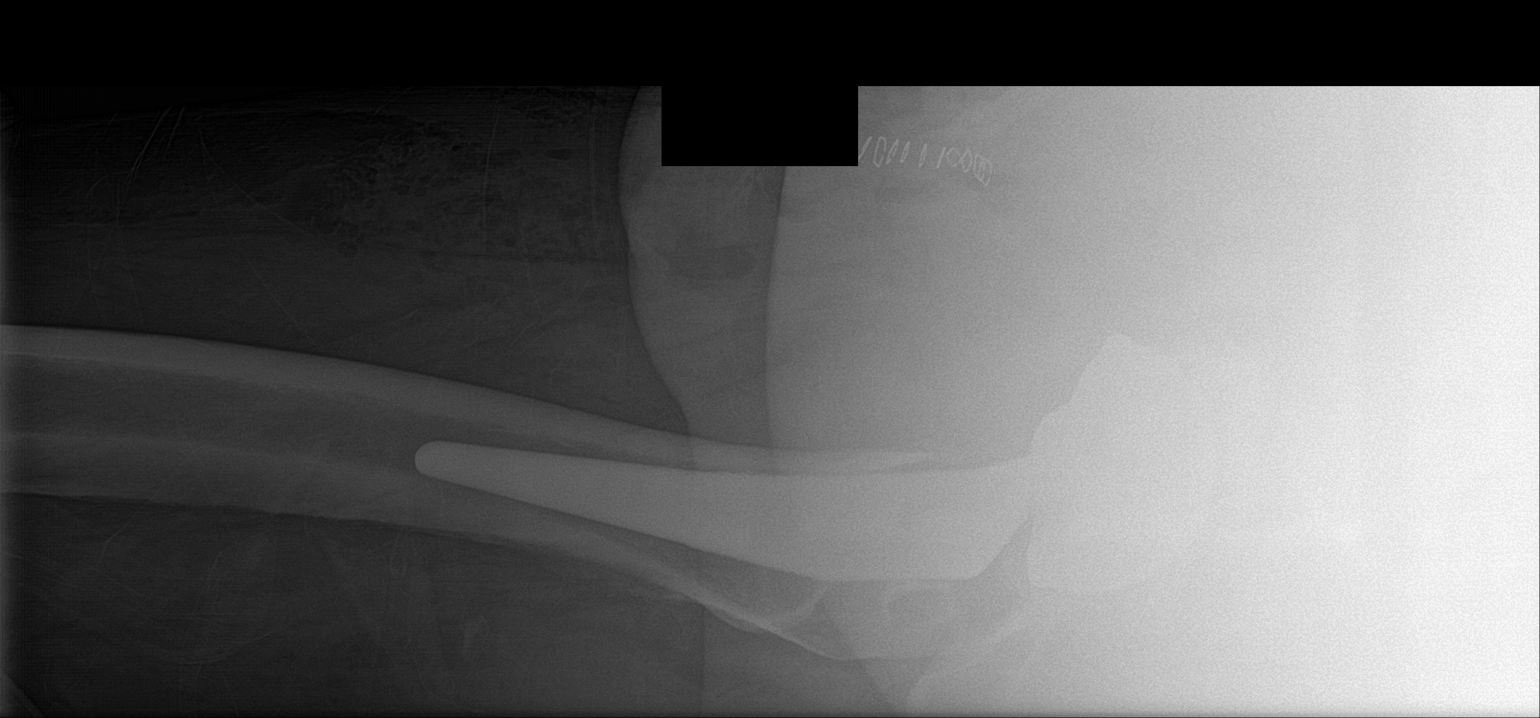

[2 of 2 positions shown; findings below may reference images not displayed]

FINDINGS: Status post right total hip arthroplasty. No evidence of immediate
hardware complication. Expected overlying soft tissue swelling and
gas. Skin staples. No dislocation or evidence of acute fracture.
IMPRESSION: Right total hip arthroplasty.
